# Patient Record
Sex: Male | Born: 1980 | Race: White | Hispanic: No | Marital: Married | Smoking: Never smoker
Health system: Southern US, Community
[De-identification: ages and names within clinical notes are randomized; demographics above are authoritative.]

## PROBLEM LIST (undated history)

## (undated) DIAGNOSIS — I209 Angina pectoris, unspecified: Secondary | ICD-10-CM

## (undated) DIAGNOSIS — K429 Umbilical hernia without obstruction or gangrene: Secondary | ICD-10-CM

## (undated) DIAGNOSIS — F419 Anxiety disorder, unspecified: Secondary | ICD-10-CM

## (undated) DIAGNOSIS — R519 Headache, unspecified: Secondary | ICD-10-CM

## (undated) DIAGNOSIS — Z87442 Personal history of urinary calculi: Secondary | ICD-10-CM

## (undated) DIAGNOSIS — I10 Essential (primary) hypertension: Secondary | ICD-10-CM

## (undated) DIAGNOSIS — N200 Calculus of kidney: Secondary | ICD-10-CM

## (undated) HISTORY — PX: DENTAL SURGERY: SHX609

## (undated) HISTORY — PX: FOOT SURGERY: SHX648

## (undated) HISTORY — PX: KIDNEY STONE SURGERY: SHX686

---

## 2001-05-12 ENCOUNTER — Emergency Department (HOSPITAL_COMMUNITY): Admission: EM | Admit: 2001-05-12 | Discharge: 2001-05-12 | Payer: Self-pay | Admitting: Emergency Medicine

## 2002-03-18 ENCOUNTER — Encounter: Payer: Self-pay | Admitting: Internal Medicine

## 2002-03-18 ENCOUNTER — Encounter: Admission: RE | Admit: 2002-03-18 | Discharge: 2002-03-18 | Payer: Self-pay | Admitting: Internal Medicine

## 2002-03-20 ENCOUNTER — Encounter: Payer: Self-pay | Admitting: Internal Medicine

## 2002-03-20 ENCOUNTER — Encounter: Admission: RE | Admit: 2002-03-20 | Discharge: 2002-03-20 | Payer: Self-pay | Admitting: Internal Medicine

## 2003-11-03 ENCOUNTER — Encounter: Admission: RE | Admit: 2003-11-03 | Discharge: 2003-11-03 | Payer: Self-pay | Admitting: Internal Medicine

## 2007-01-22 ENCOUNTER — Encounter: Payer: Self-pay | Admitting: Emergency Medicine

## 2007-01-22 ENCOUNTER — Observation Stay (HOSPITAL_COMMUNITY): Admission: EM | Admit: 2007-01-22 | Discharge: 2007-01-23 | Payer: Self-pay | Admitting: Cardiology

## 2007-01-22 ENCOUNTER — Ambulatory Visit: Payer: Self-pay | Admitting: Internal Medicine

## 2007-01-25 ENCOUNTER — Ambulatory Visit: Payer: Self-pay

## 2007-02-06 ENCOUNTER — Ambulatory Visit: Payer: Self-pay | Admitting: Cardiology

## 2008-07-14 ENCOUNTER — Ambulatory Visit: Payer: Self-pay | Admitting: Internal Medicine

## 2008-07-14 DIAGNOSIS — R109 Unspecified abdominal pain: Secondary | ICD-10-CM

## 2008-07-14 LAB — CONVERTED CEMR LAB
Nitrite: NEGATIVE
Specific Gravity, Urine: 1.01

## 2008-07-15 ENCOUNTER — Encounter: Payer: Self-pay | Admitting: Internal Medicine

## 2008-07-16 LAB — CONVERTED CEMR LAB
Bacteria, UA: NONE SEEN
RBC / HPF: NONE SEEN (ref <3)
WBC, UA: NONE SEEN cells/hpf (ref <3)

## 2008-07-17 ENCOUNTER — Encounter: Payer: Self-pay | Admitting: Internal Medicine

## 2010-08-23 NOTE — H&P (Signed)
[name], [name]                ACCOUNT NO.:  [id number]   MEDICAL RECORD NO.:  [id number]          PATIENT TYPE:  INP   LOCATION:  3733                         FACILITY:  [facility]   PHYSICIAN:  Bevelyn Buckles. Bensimhon, MDDATE OF BIRTH:  December 23, 1980   DATE OF ADMISSION:  01/22/2007  DATE OF DISCHARGE:                              HISTORY & PHYSICAL   PRIMARY CARE PHYSICIAN:  Dr. Christell Constant at [facility].   PRIMARY CARDIOLOGIST:  New and will be Dr. Gala Romney.   CHIEF COMPLAINT:  Chest pain.   HISTORY OF PRESENT ILLNESS:  [name] is 30 year old male with no  previous history of coronary artery disease.  He complains of about a 6-  week history of intermittent chest pain.  The chest pain would start  with tacky palpitations.  After the palpitations began, he would get  chest pain described as a pressure and a squeezing pain.  The worst  episode was on September 10.  There was also occasionally a stabbing  pain on September 10 associated with this.  The stabbing pain was worse  with movement and would resolve in 4-5 minutes.  The pressure and tachy  palpitations would resolved in 10-15 minutes.  He tried Pepto-Bismol one  time which decreased the pain, but the pain came back.  The chest pain  is just to the left of the sternum.  It is associated with left arm  numbness as well as shortness of breath, and since the pain is severe,  he gets a little nauseated and diaphoretic.  He has had intermittent  symptoms for 6 weeks, but the symptoms woke him last p.m. and lasted  longer than usual.  He went to the [facility] emergency room where three  nitroglycerin seemed to help.  He was transferred to [facility] for  further evaluation.  Of note, the symptoms are not exertional and not  associated with meals.   PAST MEDICAL HISTORY:  1. He has no history of diabetes, hypertension, hyperlipidemia (never      checked).  2. He has never used tobacco.  3. He does have family  history of premature coronary artery disease,      seasonal allergies and has recently had nephrolithiasis.   SURGICAL HISTORY:  Dental surgery.   ALLERGIES:  HE HAS NO ALLERGIES TO ANY MEDICATIONS BUT RECEIVED MORPHINE  AT [place] WHICH HE MADE THEM STOP THE ADMINISTRATION OF BECAUSE IT  WAS BURNING SO BADLY.   CURRENT MEDICATIONS:  He takes no prescription medications except  p.r.n., hydrocodone for the kidney stones.  He takes an occasional  Zyrtec over-the-counter.   SOCIAL HISTORY:  Lives in [place] with his wife and works in Investment banker, corporate.  He has no history of tobacco use.  He denies extreme  caffeine use and does not drink Red Bull.  Prior to having kidney  stones, he was drinking 4-5 sodas a day, but now is down to one a day.   FAMILY HISTORY:  His mother is alive at age 33 and states that she had  an MI  at 56 with a cath but no intervention, although they told her a  small vessel had been blocked, and she has also had a stroke and says  that she has an aneurysm in her abdomen.  His father is alive at age 41  with no history of coronary artery disease.  He has no siblings with  coronary artery disease.   REVIEW OF SYSTEMS:  He states his appetite has been poor.  He has not  been sleeping well.  He has had fatigue.  His weight is down about 10  pounds.  He has had some constipation recently and had diarrhea last  night.  He has had some abdominal pain especially in the right upper  quadrant.  He had flank pain with the kidney stones.  The chest pain as  described above.  He feels that he has dyspnea on exertion which is  recent.  He has had no fevers or chills.  Full 14-point review of  systems is otherwise negative.   PHYSICAL EXAMINATION:  VITAL SIGNS:  Temperature is 98.5, blood pressure  130/71, pulse 99, respiratory rate 18, O2 saturation 98% on room air.  GENERAL:  He is a well-developed, well-nourished white male in no acute  distress.  HEENT:   Normal.  NECK:  There is no lymphadenopathy, thyromegaly, bruit or JVD noted.  CV:  Heart is regular rate and rhythm with an S1-S2 and no significant  murmur, rub or gallop is noted.  Distal pulses are intact in all four  extremities.  LUNGS:  Clear to auscultation bilaterally.  SKIN:  No rashes or lesions are noted.  ABDOMEN:  Soft and he has active bowel sounds and no splenomegaly is  noted, but he has tenderness in the left upper quadrant, most  significantly with some guarding but no rebound, less tenderness in the  right upper quadrant and minimal tenderness in the lower quadrants.  The  spleen is not felt to be enlarged.  EXTREMITIES:  There is no cyanosis, clubbing or edema noted.  MUSCULOSKELETAL:  There is no joint deformity or effusion and no spine  or CVA tenderness.  NEUROLOGICAL:  He is alert and oriented.  Cranial nerves II-XII grossly  intact.   STUDIES:  Chest x-ray no acute disease.   EKG is sinus rhythm rate 92 with no acute ischemic changes.   LABORATORY VALUES:  Hemoglobin 16.5, hematocrit 46.9, WBC 5.7, platelets  198.  Sodium 140, potassium 3.2, chloride 106, CO2 30, BUN 17,  creatinine 1.09, glucose 133.  Point of care markers negative x2 and CK-  MB and troponin I negative times one.   IMPRESSION:  [name] is a 30 year old male with a several week history  of constitutional symptoms who now also has chest pain, abdominal pain  and profound fatigue.  His EKG and initial cardiac enzymes are negative.  On exam, he is very tender over the spleen tip.  We will check an  abdominal and chest CT with contrast as well as an echocardiogram to  assess his LV function and wall motion.  The family is very concerned in  regards to a possible MI.  They were reassured that this was not an  acute MI.  If all other tests are negative, then cardiac catheterization  can be discussed with them to further evaluate his chest pain.  We will  check a TSH, a sed rate, and LDH,  ANA, amylase and lipase as well as a  hemoglobin A1c and a  lipid profile.  His potassium was supplemented at  [facility] and this will be rechecked.      Theodore Demark, PA-C      Bevelyn Buckles. Bensimhon, MD  Electronically Signed    RB/MEDQ  D:  01/22/2007  T:  01/22/2007  Job:  [id number]

## 2010-08-23 NOTE — Assessment & Plan Note (Signed)
[name]                            CARDIOLOGY OFFICE NOTE   [name], [name]                       MRN:          [id number]  DATE:02/06/2007                            DOB:          06/25/1980    The primary is Dr. [place].   REASON FOR PRESENTATION:  Evaluate patient with chest pain.   HISTORY OF PRESENT ILLNESS:  The patient was recently hospitalized with  chest discomfort. He ruled out for myocardial infarction. He had an  echocardiogram which demonstrated no evidence of ischemia or infarct.  His EF was 55%. There were no valvular abnormalities. He did also have a  stress perfusion study as an outpatient. This demonstrated an EF also of  55% with no evidence of ischemia or infarct.   The patient has continued to have occasional chest discomfort. He seems  now to associate this with food. He was started on Protonix. However, he  noted the other day then when he ate some sausage and some bacon he did  have some of this chest discomfort following that. He has been back to  work. He has had no symptoms associated with exertion. He has had no  neck or arm discomfort. He has had no shortness of breath, PND or  orthopnea.   PAST MEDICAL HISTORY:  There is no history of hypertension, diabetes or  hyperlipidemia.   ALLERGIES:  None.   MEDICATIONS:  Protonix 40 mg daily.   REVIEW OF SYSTEMS:  As stated in the HPI and otherwise negative for  other systems.   PHYSICAL EXAMINATION:  The patient is in no distress. Blood pressure  134/84, heart 86 and regular, weight 142 pounds, body mass index 22.  HEENT:  Eyelids unremarkable; pupils are equal, round, and reactive to  light; fundi not visualized. Oral mucosal unremarkable.  NECK:  No jugular venous distention at 45 degrees; carotid upstrokes  brisk and symmetrical; no bruits; no thyromegaly.  LYMPHATICS:  No cervical, axillary, or inguinal adenopathy.  LUNGS:  Clear to auscultation  bilaterally.  BACK:  No costovertebral angle tenderness.  CHEST:  Unremarkable.  HEART:  PMI not displaced or sustained; S1 and S2 within normal limits;  no S3, no murmurs.  ABDOMEN:  Positive bowel sounds, normal in frequency and pitch, no  bruits, no rebound, no guarding, no midline pulsatile mass, no  organomegaly.  SKIN:  No rashes, no nodules.  EXTREMITIES:  2+ pulses throughout, no edema.   EKG:  Sinus rhythm, rate 86, axis within normal limits, intervals within  normal limits, no acute ST-T wave changes.   ASSESSMENT AND PLAN:  1. Chest discomfort. The patient's chest discomfort sounds like it has      gastroenterologic etiology. He says that he has had some      questionable gallbladder problems in the past. I have asked to talk      with his primary care doctor about possibly a GI referral since he      is having some persistent symptoms on the Protonix. At this point,      no further cardiovascular testing  is suggested. He should continue      with primary risk reduction.  2. Followup will be in this office as needed.     Howard Rotunda, MD, [facility]  Electronically Signed    JH/MedQ  DD: 02/06/2007  DT: 02/06/2007  Job #: [id number]   cc:   [name], M.D.

## 2010-08-23 NOTE — Discharge Summary (Signed)
[name], [name]                ACCOUNT NO.:  [id number]   MEDICAL RECORD NO.:  [id number]          PATIENT TYPE:  OBV   LOCATION:  3733                         FACILITY:  [facility]   PHYSICIAN:  Bevelyn Buckles. Bensimhon, MDDATE OF BIRTH:  1980/07/19   DATE OF ADMISSION:  01/22/2007  DATE OF DISCHARGE:  01/23/2007                               DISCHARGE SUMMARY   PRIMARY CARE PHYSICIAN:  Dr. Christell Constant at [facility]   PRIMARY CARDIOLOGIST:  Dr. Rollene Rotunda in [place] office.   PROCEDURES PERFORMED DURING HOSPITALIZATION:  None.   FINAL DISCHARGE DIAGNOSES:  1. Chest pain with negative cardiac enzymes and EKG appeared to be      more musculoskeletal.  2. Gastroesophageal reflux disease.   HOSPITAL COURSE:  This is a 30 year old male with no previous history of  coronary artery disease with a six-week history of intermittent chest  discomfort.  The pain would begin and then he would have tachy  palpitations.  The pains were described as a squeezing pain,  occasionally stabbing and did worsen with movement.  The pressure and  the tachycardia would resolve within 10 to 15 minutes.  Pepto-Bismol did  decrease the pain at home, but it did come back.  It was associated with  left arm numbness as well as shortness of breath.  Secondary to the  severity of the pain, he also gets a little nauseated, diaphoretic, and  had decided to be seen at [facility] Emergency Room where the  nitroglycerin did seem to help.  He was transferred to [facility] for  further evaluation and seen and evaluated by Dr. Arvilla Meres.   The patient was admitted and cardiac enzymes were cycled to evaluate for  ischemic component to his symptoms.  A 2-D echo was also completed  revealing an EF of 55% with normal wall motion.  His cardiac enzymes  were found to be negative x3.  He also had a CT scan to rule out  pulmonary embolus; the result was stable nephrolithiasis.  CT of the  brain without  contrast revealed no acute intrapelvic pathology.  There  was no evidence of acute pulmonary thromboembolism with the CT chest.   The patient was seen and examined by Dr. Arvilla Meres on January 13, 2007 with stable vital signs and negative enzymes and EKG.  The patient  will be discharged home on Protonix once a day and scheduled for an  exercise stress test Myoview study.  The patient will be followed by Dr.  Antoine Poche in [place].   DISCHARGE LABS:  Cholesterol 142, triglycerides 86, HDL 31, LDL 94.  Hemoglobin 16.5, hematocrit 48.3, white blood cells 5.2, platelets 197.  Sodium 141, potassium 4.0, chloride 105, CO2 32, glucose 90, BUN 11,  creatinine 1.26.  TSH 1.033.  Hemoglobin A1c 5.3.  Sed rate 1.  Cardiac  enzymes negative x3 at less than 0.01.   EKG:  Normal sinus rhythm with occasional sinus arrhythmia, ventricular  rate of 92 beats per minute.   DISCHARGE VITAL SIGNS:  Blood pressure 128/76, heart rate 70, O2 sat 99%  on room air.   DISCHARGE MEDICATIONS:  Protonix 40 mg once a day.   ALLERGIES:  No known drug allergies.   FOLLOWUP PLANS AND APPOINTMENTS:  1. The patient will have a stress Myoview completed on October 17 at      11:45 a.m.  2. The patient will follow up with Dr. Rollene Rotunda on August 29 at      11:30 p.m. for discussion of test results.  3. The patient will follow up with Dr. Christell Constant at [facility] for continued medical management.  4. The patient has been given our phone number in [place] to call      should he have any difficulties prior to the test and followup      appointment.   Time spent with the patient to include physician time:  35 minutes.      Bettey Mare. Lyman Bishop, NP      Bevelyn Buckles. Bensimhon, MD  Electronically Signed    KML/MEDQ  D:  01/23/2007  T:  01/23/2007  Job:  [id number]

## 2011-01-18 LAB — CBC
HCT: 48.3
Hemoglobin: 16.5
MCV: 88.9
Platelets: 197
RBC: 5.43
WBC: 5.2

## 2011-01-18 LAB — BASIC METABOLIC PANEL
GFR calc Af Amer: >60
GFR calc non Af Amer: >60

## 2011-01-18 LAB — LIPID PANEL
HDL: 31 — ABNORMAL LOW
Total CHOL/HDL Ratio: 4.6
Triglycerides: 86

## 2011-01-19 LAB — CARDIAC PANEL(CRET KIN+CKTOT+MB+TROPI)
CK, MB: 1.2
Relative Index: INVALID
Total CK: 69
Troponin I: 0.01

## 2011-01-19 LAB — URINALYSIS, ROUTINE W REFLEX MICROSCOPIC
Bilirubin Urine: NEGATIVE
Glucose, UA: NEGATIVE
Hgb urine dipstick: NEGATIVE
Ketones, ur: NEGATIVE
Nitrite: NEGATIVE
Protein, ur: NEGATIVE
Specific Gravity, Urine: 1.012
Urobilinogen, UA: 0.2
pH: 6.5

## 2011-01-19 LAB — HEMOGLOBIN A1C: Mean Plasma Glucose: 111

## 2011-01-19 LAB — DRUGS OF ABUSE SCREEN W/O ALC, ROUTINE URINE
Barbiturate Quant, Ur: NEGATIVE
Benzodiazepines.: NEGATIVE
Creatinine,U: 64.2
Methadone: NEGATIVE
Opiate Screen, Urine: NEGATIVE
Phencyclidine (PCP): NEGATIVE
Propoxyphene: NEGATIVE

## 2011-01-19 LAB — COMPREHENSIVE METABOLIC PANEL WITH GFR
ALT: 15
AST: 17
Albumin: 4.1
Alkaline Phosphatase: 63
BUN: 11
CO2: 26
Calcium: 9.5
Chloride: 106
Creatinine, Ser: 1
GFR calc Af Amer: >60
GFR calc non Af Amer: >60
Glucose, Bld: 99
Potassium: 3.9
Sodium: 141
Total Bilirubin: 0.7
Total Protein: 6.3

## 2011-01-19 LAB — CBC
HCT: 46.9
RDW: 12.9

## 2011-01-19 LAB — DIFFERENTIAL
Basophils Relative: 0
Eosinophils Absolute: 0.1
Lymphocytes Relative: 38
Lymphs Abs: 2.2
Monocytes Absolute: 0.6
Neutro Abs: 2.7
Neutrophils Relative %: 48

## 2011-01-19 LAB — CK TOTAL AND CKMB (NOT AT ARMC)
CK, MB: 1.6
Relative Index: INVALID
Total CK: 95

## 2011-01-19 LAB — POCT CARDIAC MARKERS
CKMB, poc: <1 — ABNORMAL LOW
CKMB, poc: <1 — ABNORMAL LOW
Myoglobin, poc: 32.8
Myoglobin, poc: 39.1
Operator id: 218581
Operator id: 218581
Troponin i, poc: <0.05
Troponin i, poc: <0.05

## 2011-01-19 LAB — TSH: TSH: 1.033

## 2011-01-19 LAB — BASIC METABOLIC PANEL
BUN: 17
CO2: 30
Calcium: 9.7
GFR calc Af Amer: >60

## 2011-01-19 LAB — AMYLASE: Amylase: 63

## 2011-01-19 LAB — ANA: Anti Nuclear Antibody(ANA): NEGATIVE

## 2011-01-19 LAB — TROPONIN I: Troponin I: 0.02

## 2011-01-19 LAB — LACTATE DEHYDROGENASE: LDH: 129

## 2011-11-18 ENCOUNTER — Emergency Department (HOSPITAL_COMMUNITY)
Admission: EM | Admit: 2011-11-18 | Discharge: 2011-11-18 | Disposition: A | Discharge status: Home / Self Care | Payer: 59 | Attending: Emergency Medicine | Admitting: Emergency Medicine

## 2011-11-18 ENCOUNTER — Emergency Department (HOSPITAL_COMMUNITY): Payer: 59

## 2011-11-18 ENCOUNTER — Encounter (HOSPITAL_COMMUNITY): Payer: Self-pay

## 2011-11-18 DIAGNOSIS — R109 Unspecified abdominal pain: Secondary | ICD-10-CM | POA: Insufficient documentation

## 2011-11-18 DIAGNOSIS — Z79899 Other long term (current) drug therapy: Secondary | ICD-10-CM | POA: Insufficient documentation

## 2011-11-18 DIAGNOSIS — N201 Calculus of ureter: Secondary | ICD-10-CM | POA: Insufficient documentation

## 2011-11-18 HISTORY — DX: Calculus of kidney: N20.0

## 2011-11-18 LAB — BASIC METABOLIC PANEL
BUN: 16 mg/dL (ref 6–23)
Chloride: 101 mEq/L (ref 96–112)
Creatinine, Ser: 1.9 mg/dL — ABNORMAL HIGH (ref 0.50–1.35)
GFR calc Af Amer: 53 mL/min — ABNORMAL LOW (ref >90)
GFR calc non Af Amer: 45 mL/min — ABNORMAL LOW (ref >90)

## 2011-11-18 LAB — CBC WITH DIFFERENTIAL/PLATELET
Basophils Relative: 0 % (ref 0–1)
Eosinophils Absolute: 0 10*3/uL (ref 0.0–0.7)
Eosinophils Relative: 0 % (ref 0–5)
HCT: 44 % (ref 39.0–52.0)
Hemoglobin: 15.7 g/dL (ref 13.0–17.0)
MCH: 30.4 pg (ref 26.0–34.0)
MCHC: 35.7 g/dL (ref 30.0–36.0)
MCV: 85.3 fL (ref 78.0–100.0)
Monocytes Absolute: 0.6 10*3/uL (ref 0.1–1.0)
Monocytes Relative: 6 % (ref 3–12)

## 2011-11-18 LAB — URINALYSIS, ROUTINE W REFLEX MICROSCOPIC
Glucose, UA: NEGATIVE mg/dL
Leukocytes, UA: NEGATIVE
Nitrite: NEGATIVE
Protein, ur: NEGATIVE mg/dL
Urobilinogen, UA: 0.2 mg/dL (ref 0.0–1.0)
pH: 6 (ref 5.0–8.0)

## 2011-11-18 LAB — URINE MICROSCOPIC-ADD ON

## 2011-11-18 MED ORDER — NAPROXEN 500 MG PO TABS: 500.0000 mg | ORAL_TABLET | Freq: Two times a day (BID) | ORAL | Status: DC

## 2011-11-18 MED ORDER — KETOROLAC TROMETHAMINE 60 MG/2ML IM SOLN
60.0000 mg | Freq: Once | INTRAMUSCULAR | Status: AC
  Administered 2011-11-18: 60 mg via INTRAMUSCULAR
  Filled 2011-11-18: qty 2

## 2011-11-18 MED ORDER — ONDANSETRON 4 MG PO TBDP
ORAL_TABLET | ORAL | Status: AC
  Administered 2011-11-18: 4 mg via SUBLINGUAL
  Filled 2011-11-18: qty 1

## 2011-11-18 MED ORDER — HYDROMORPHONE HCL PF 2 MG/ML IJ SOLN
2.0000 mg | Freq: Once | INTRAMUSCULAR | Status: AC
  Administered 2011-11-18: 2 mg via INTRAMUSCULAR
  Filled 2011-11-18: qty 1

## 2011-11-18 MED ORDER — OXYCODONE-ACETAMINOPHEN 5-325 MG PO TABS: 1.0000 | ORAL_TABLET | ORAL | Status: AC | PRN

## 2011-11-18 NOTE — ED Notes (Signed)
Patient given second 4 mg odt zofran. Patient still complaining of nausea, states only has to vomit when he sits up. Told patient we could wait a few minutes to give the medication time to work before attempting to get him up.

## 2011-11-18 NOTE — ED Notes (Signed)
Patient stated that he felt as if he was ready to go home. Helped patient out to car via wheelchair. Patient spit up small amount of emesis in car prior to leaving hospital parking lot. Patient stated he still wanted to try to make it home and go to sleep.

## 2011-11-18 NOTE — ED Provider Notes (Signed)
History     CSN: [id number]  Arrival date & time 11/18/11  4098   First MD Initiated Contact with Patient 11/18/11 1859      Chief Complaint  Patient presents with  . Flank Pain    (Consider location/radiation/quality/duration/timing/severity/associated sxs/prior treatment) Patient is a 31 y.o. male presenting with flank pain. The history is provided by the patient and the spouse.  Flank Pain This is a recurrent problem. The current episode started 6 to 12 hours ago. The problem has been gradually worsening. Pertinent negatives include no chest pain, no abdominal pain, no headaches and no shortness of breath.   patient with injury to his left lower back about 3 weeks ago after picking up a trailer patient was seen in emergency department somewhere at that time and treated with pain meds. That did improve over several days today similar pain is restarted in the same area of his left lower back does move to his left flank some. Patient has had a history of kidney stones several times in the past the patient states has not type pain that he relates to kidney stones at all. Patient did take a Percocet tablet at 3:00 this afternoon without relief. His primary care Dr. is [facility].  Pain is described as a 10 out of 10 it's sharp and dull ache nonradiating no numbness to the feet no pain radiates into the legs no focal deficits. No incontinence.  Past Medical History  Diagnosis Date  . Kidney stones     Past Surgical History  Procedure Date  . Dental surgery     History reviewed. No pertinent family history.  History  Substance Use Topics  . Smoking status: Never Smoker   . Smokeless tobacco: Not on file  . Alcohol Use: No      Review of Systems  Constitutional: Negative for fever.  HENT: Negative for neck pain.   Eyes: Negative for redness.  Respiratory: Negative for shortness of breath.   Cardiovascular: Negative for chest pain.    Gastrointestinal: Negative for abdominal pain.  Genitourinary: Positive for flank pain.  Musculoskeletal: Positive for back pain.  Skin: Negative for rash.  Neurological: Negative for headaches.  Hematological: Does not bruise/bleed easily.    Allergies  Bee venom  Home Medications   Current Outpatient Rx  Name Route Sig Dispense Refill  . AMLODIPINE BESYLATE 5 MG PO TABS Oral Take 5 mg by mouth at bedtime.    Marland Kitchen CETIRIZINE HCL 10 MG PO TABS Oral Take 10 mg by mouth daily.    . IBUPROFEN 200 MG PO TABS Oral Take 200 mg by mouth every 6 (six) hours as needed.    Marland Kitchen NAPROXEN 500 MG PO TABS Oral Take 1 tablet (500 mg total) by mouth 2 (two) times daily. 14 tablet 0  . OXYCODONE-ACETAMINOPHEN 5-325 MG PO TABS Oral Take 1-2 tablets by mouth every 4 (four) hours as needed for pain. 14 tablet 0    BP 143/96  Pulse 96  Resp 20  SpO2 100%  Physical Exam  Nursing note and vitals reviewed. Constitutional: He is oriented to person, place, and time. He appears well-developed and well-nourished.  HENT:  Head: Normocephalic and atraumatic.  Eyes: Conjunctivae and EOM are normal. Pupils are equal, round, and reactive to light.  Neck: Normal range of motion. Neck supple.  Cardiovascular: Normal rate, regular rhythm and normal heart sounds.   No murmur heard. Pulmonary/Chest: Effort normal and breath sounds normal.  Abdominal: Soft. Bowel  sounds are normal. There is no tenderness.  Musculoskeletal: Normal range of motion. He exhibits no edema.       Except for some mild tenderness in the left lumbar area no spasm.  Neurological: He is alert and oriented to person, place, and time. No cranial nerve deficit. He exhibits normal muscle tone. Coordination normal.  Skin: Skin is warm. No rash noted.    ED Course  Procedures (including critical care time)  Labs Reviewed  URINALYSIS, ROUTINE W REFLEX MICROSCOPIC - Abnormal; Notable for the following:    Specific Gravity, Urine >1.030 (*)      Hgb urine dipstick LARGE (*)     Bilirubin Urine SMALL (*)     Ketones, ur TRACE (*)     All other components within normal limits  CBC WITH DIFFERENTIAL - Abnormal; Notable for the following:    Neutrophils Relative 84 (*)     Neutro Abs 7.9 (*)     Lymphocytes Relative 10 (*)     All other components within normal limits  BASIC METABOLIC PANEL - Abnormal; Notable for the following:    Glucose, Bld 107 (*)     Creatinine, Ser 1.90 (*)     GFR calc non Af Amer 45 (*)     GFR calc Af Amer 53 (*)     All other components within normal limits  URINE MICROSCOPIC-ADD ON   Ct Abdomen Pelvis Wo Contrast  11/18/2011  *RADIOLOGY REPORT*  Clinical Data: Left flank pain.  CT ABDOMEN AND PELVIS WITHOUT CONTRAST  Technique:  Multidetector CT imaging of the abdomen and pelvis was performed following the standard protocol without intravenous contrast.  Comparison: 01/22/2007.  Findings: Left ureteral 5.2 mm obstructing stone located 17 mm proximal to the left ureteral vesicle junction.  Moderate left side hydronephrosis.  Several bilateral nonobstructing renal calculi.  No extraluminal bowel inflammatory process, free fluid or free air.  Evaluation of solid abdominal viscera is limited by lack of IV contrast.  Taking this limitation into account no focal liver, splenic, pancreatic or adrenal lesion.  No calcified gallstones.  No abdominal aortic aneurysm.  Contracted urinary bladder.  No bony destructive lesion.  IMPRESSION:  Left ureteral 5.2 mm obstructing stone located 17 mm proximal to the left ureteral vesicle junction.  Moderate left side hydronephrosis.  Several bilateral nonobstructing renal calculi.  Original Report Authenticated By: Fuller Canada, M.D.   Results for orders placed during the hospital encounter of 11/18/11  URINALYSIS, ROUTINE W REFLEX MICROSCOPIC      Component Value Range   Color, Urine YELLOW  YELLOW   APPearance CLEAR  CLEAR   Specific Gravity, Urine >1.030 (*) 1.005 - 1.030     pH 6.0  5.0 - 8.0   Glucose, UA NEGATIVE  NEGATIVE mg/dL   Hgb urine dipstick LARGE (*) NEGATIVE   Bilirubin Urine SMALL (*) NEGATIVE   Ketones, ur TRACE (*) NEGATIVE mg/dL   Protein, ur NEGATIVE  NEGATIVE mg/dL   Urobilinogen, UA 0.2  0.0 - 1.0 mg/dL   Nitrite NEGATIVE  NEGATIVE   Leukocytes, UA NEGATIVE  NEGATIVE  URINE MICROSCOPIC-ADD ON      Component Value Range   WBC, UA 3-6  <3 WBC/hpf   RBC / HPF 21-50  <3 RBC/hpf  CBC WITH DIFFERENTIAL      Component Value Range   WBC 9.4  4.0 - 10.5 K/uL   RBC 5.16  4.22 - 5.81 MIL/uL   Hemoglobin 15.7  13.0 - 17.0 g/dL  HCT 44.0  39.0 - 52.0 %   MCV 85.3  78.0 - 100.0 fL   MCH 30.4  26.0 - 34.0 pg   MCHC 35.7  30.0 - 36.0 g/dL   RDW 16.1  09.6 - 04.5 %   Platelets 193  150 - 400 K/uL   Neutrophils Relative 84 (*) 43 - 77 %   Neutro Abs 7.9 (*) 1.7 - 7.7 K/uL   Lymphocytes Relative 10 (*) 12 - 46 %   Lymphs Abs 0.9  0.7 - 4.0 K/uL   Monocytes Relative 6  3 - 12 %   Monocytes Absolute 0.6  0.1 - 1.0 K/uL   Eosinophils Relative 0  0 - 5 %   Eosinophils Absolute 0.0  0.0 - 0.7 K/uL   Basophils Relative 0  0 - 1 %   Basophils Absolute 0.0  0.0 - 0.1 K/uL  BASIC METABOLIC PANEL      Component Value Range   Sodium 137  135 - 145 mEq/L   Potassium 3.7  3.5 - 5.1 mEq/L   Chloride 101  96 - 112 mEq/L   CO2 26  19 - 32 mEq/L   Glucose, Bld 107 (*) 70 - 99 mg/dL   BUN 16  6 - 23 mg/dL   Creatinine, Ser 4.09 (*) 0.50 - 1.35 mg/dL   Calcium 81.1  8.4 - 91.4 mg/dL   GFR calc non Af Amer 45 (*) >90 mL/min   GFR calc Af Amer 53 (*) >90 mL/min     1. Ureteral stone       MDM  Despite patient saying the pain is not consistent with passed kidney stones he does have a fair amount of hematuria on his urinalysis will go ahead and get CT of abdomen and pelvis without contrast to further evaluate the possibility of a ureteral stone.  CT scan of the abdomen confirms a 5.2 mm left ureteral stone that is 17 mm from the bladder. Patient  should pass a stone soon. Pain improved in the emergency department. We'll give a dose of Toradol prior to discharge. Patient has Percocet at home for now patient has had several stones in the past primary care doctors used to treating him for this. Urinalysis consistent with hematuria no evidence urinary tract infection no sniffing and leukocytosis mild elevation in creatinine at 1.9.       Shelda Jakes, MD 11/18/11 [phone]

## 2011-11-18 NOTE — ED Notes (Signed)
Spoke with Dr. Preston Fleeting regarding patient still vomiting. Dr. Saul Fordyce out of department. Dr. Preston Fleeting stated to give another 4 mg odt zofran.

## 2011-11-18 NOTE — ED Notes (Signed)
Patient began vomiting when getting in wheelchair to be discharged. Dr. Saul Fordyce stated to give patient odt 4 mg zofran, medication given to patient.

## 2011-11-18 NOTE — ED Notes (Signed)
Lower left flank pain, has a history of kidney stones but states it does not feel like one. 2-3 weeks ago I picked up a trailer and felt something pull. Did have pain but that went away.

## 2011-11-20 ENCOUNTER — Emergency Department (HOSPITAL_COMMUNITY): Payer: 59

## 2011-11-20 ENCOUNTER — Emergency Department (HOSPITAL_COMMUNITY)
Admission: EM | Admit: 2011-11-20 | Discharge: 2011-11-20 | Disposition: A | Discharge status: Home / Self Care | Payer: 59 | Attending: Emergency Medicine | Admitting: Emergency Medicine

## 2011-11-20 ENCOUNTER — Encounter (HOSPITAL_COMMUNITY): Payer: Self-pay | Admitting: *Deleted

## 2011-11-20 DIAGNOSIS — M545 Low back pain, unspecified: Secondary | ICD-10-CM | POA: Insufficient documentation

## 2011-11-20 DIAGNOSIS — N201 Calculus of ureter: Secondary | ICD-10-CM | POA: Insufficient documentation

## 2011-11-20 DIAGNOSIS — R109 Unspecified abdominal pain: Secondary | ICD-10-CM | POA: Insufficient documentation

## 2011-11-20 LAB — CBC WITH DIFFERENTIAL/PLATELET
Basophils Relative: 0 % (ref 0–1)
Eosinophils Absolute: 0.1 10*3/uL (ref 0.0–0.7)
Eosinophils Relative: 2 % (ref 0–5)
HCT: 43.9 % (ref 39.0–52.0)
Hemoglobin: 15.5 g/dL (ref 13.0–17.0)
MCH: 30.4 pg (ref 26.0–34.0)
MCHC: 35.3 g/dL (ref 30.0–36.0)
Monocytes Absolute: 0.5 10*3/uL (ref 0.1–1.0)
Monocytes Relative: 9 % (ref 3–12)
Neutrophils Relative %: 70 % (ref 43–77)

## 2011-11-20 LAB — URINALYSIS, ROUTINE W REFLEX MICROSCOPIC
Ketones, ur: NEGATIVE mg/dL
Nitrite: NEGATIVE
pH: 5.5 (ref 5.0–8.0)

## 2011-11-20 LAB — BASIC METABOLIC PANEL
BUN: 19 mg/dL (ref 6–23)
Calcium: 9.8 mg/dL (ref 8.4–10.5)
Creatinine, Ser: 1.46 mg/dL — ABNORMAL HIGH (ref 0.50–1.35)
GFR calc Af Amer: 72 mL/min — ABNORMAL LOW (ref >90)
GFR calc non Af Amer: 63 mL/min — ABNORMAL LOW (ref >90)

## 2011-11-20 MED ORDER — HYDROMORPHONE HCL PF 1 MG/ML IJ SOLN
1.0000 mg | Freq: Once | INTRAMUSCULAR | Status: AC
  Administered 2011-11-20: 1 mg via INTRAVENOUS
  Filled 2011-11-20: qty 1

## 2011-11-20 MED ORDER — ONDANSETRON HCL 4 MG/2ML IJ SOLN
4.0000 mg | Freq: Once | INTRAMUSCULAR | Status: AC
  Administered 2011-11-20: 4 mg via INTRAVENOUS
  Filled 2011-11-20: qty 2

## 2011-11-20 MED ORDER — SODIUM CHLORIDE 0.9 % IV BOLUS (SEPSIS)
500.0000 mL | Freq: Once | INTRAVENOUS | Status: AC
  Administered 2011-11-20: 17:00:00 via INTRAVENOUS

## 2011-11-20 MED ORDER — SODIUM CHLORIDE 0.9 % IV SOLN: INTRAVENOUS | Status: DC

## 2011-11-20 MED ORDER — ONDANSETRON 4 MG PO TBDP: 4.0000 mg | ORAL_TABLET | Freq: Three times a day (TID) | ORAL | Status: AC | PRN

## 2011-11-20 MED ORDER — PROMETHAZINE HCL 25 MG PO TABS: 25.0000 mg | ORAL_TABLET | Freq: Four times a day (QID) | ORAL | Status: DC | PRN

## 2011-11-20 NOTE — ED Notes (Signed)
Pt c/o left flank pain off and on since Saturday. Pt states that he was seen on Saturday and told that he had a kidney stone. States that his pain and nausea are worse today. Pt alert and oriented x 3. Skin warm and dry. Color pink. Breath sounds clear and equal bilaterally.

## 2011-11-20 NOTE — ED Notes (Signed)
Being tx for kidney stone, seen here 8/10. Says pain is worse.Nausea,

## 2011-11-20 NOTE — ED Provider Notes (Signed)
History  This chart was scribed for Shelda Jakes, MD by Erskine Emery. This patient was seen in room APA07/APA07 and the patient's care was started at 16:20.   CSN: [id number]  Arrival date & time 11/20/11  1612   First MD Initiated Contact with Patient 11/20/11 1620      Chief Complaint  Patient presents with  . Flank Pain    (Consider location/radiation/quality/duration/timing/severity/associated sxs/prior treatment) HPI [name] is a 31 y.o. male who presents to the Emergency Department complaining of severe left flank and lower back pain that first began on Friday (2 days ago) with no associated fevers, chest pain, SOB, testicle pain, or groin pain. Pt was seen here on 8/10 (2 days ago) for the same complaint, was scanned, and a kidney stone was found (under 6mm in size, less than 17mm from opening). Pt reports his urine is darker now than it was then. Pt denies passing the stone and reports yesterday he had constant left abdominal pain that has moved to the left flank and lower back today. Pt reports the pain came back at 9am this morning, whereupon he began taking pain medication again. Pt reports he has taken 3 pain pills since then that only temporarily relieve the pain. Pt works in [employer] and had a back injury about a month ago that was healing well.    Past Medical History  Diagnosis Date  . Kidney stones     Past Surgical History  Procedure Date  . Dental surgery     History reviewed. No pertinent family history.  History  Substance Use Topics  . Smoking status: Never Smoker   . Smokeless tobacco: Not on file  . Alcohol Use: No      Review of Systems  Constitutional: Negative for fever and chills.  HENT: Negative for neck pain.   Respiratory: Negative for shortness of breath.   Cardiovascular: Negative for chest pain.  Gastrointestinal: Negative for nausea and vomiting.  Genitourinary: Negative for penile pain and testicular pain.    Musculoskeletal: Positive for back pain (left lower).  Skin: Negative for rash.  Neurological: Negative for weakness.  Psychiatric/Behavioral: Negative for hallucinations.  All other systems reviewed and are negative.    Allergies  Bee venom  Home Medications   Current Outpatient Rx  Name Route Sig Dispense Refill  . AMLODIPINE BESYLATE 5 MG PO TABS Oral Take 5 mg by mouth at bedtime.    Marland Kitchen CETIRIZINE HCL 10 MG PO TABS Oral Take 10 mg by mouth daily.    . IBUPROFEN 200 MG PO TABS Oral Take 200 mg by mouth every 6 (six) hours as needed.    . OXYCODONE-ACETAMINOPHEN 5-325 MG PO TABS Oral Take 1-2 tablets by mouth every 4 (four) hours as needed for pain. 14 tablet 0  . ONDANSETRON 4 MG PO TBDP Oral Take 1 tablet (4 mg total) by mouth every 8 (eight) hours as needed for nausea. 12 tablet 0  . PROMETHAZINE HCL 25 MG PO TABS Oral Take 1 tablet (25 mg total) by mouth every 6 (six) hours as needed for nausea. 12 tablet 0    Triage Vitals: BP 137/90  Pulse 103  Temp 98.3 F (36.8 C) (Oral)  Resp 20  Ht 5\' 5"  (1.651 m)  Wt 163 lb (73.936 kg)  BMI 27.12 kg/m2  SpO2 99%  Physical Exam  Nursing note and vitals reviewed. Constitutional: He is oriented to person, place, and time. He appears well-developed and well-nourished. No distress.  HENT:  Head: Normocephalic and atraumatic.  Mouth/Throat: Oropharynx is clear and moist.  Eyes: EOM are normal.  Neck: Neck supple. No tracheal deviation present.  Cardiovascular: Normal rate, regular rhythm and normal heart sounds.   No murmur heard. Pulmonary/Chest: Effort normal and breath sounds normal. No respiratory distress.  Abdominal: Soft. Bowel sounds are normal. There is no tenderness.  Musculoskeletal: Normal range of motion. He exhibits no edema.  Neurological: He is alert and oriented to person, place, and time. No cranial nerve deficit. Coordination normal.  Skin: Skin is warm and dry.  Psychiatric: He has a normal mood and affect.     ED Course  Procedures (including critical care time) DIAGNOSTIC STUDIES: Oxygen Saturation is 99% on room air, normal by my interpretation.    COORDINATION OF CARE: 16:54--I evaluated the patient and we discussed a treatment plan including CT scans to which the pt agreed. I informed the pt that it is possible he has another kidney stone.   17:50--I rechecked the pt. His kidney stone has not moved and he has no new stones. His urologist is Dr. Annabell Howells.   Labs Reviewed  URINALYSIS, ROUTINE W REFLEX MICROSCOPIC - Abnormal; Notable for the following:    Specific Gravity, Urine >1.030 (*)     Hgb urine dipstick LARGE (*)     Bilirubin Urine SMALL (*)     Protein, ur 30 (*)     All other components within normal limits  BASIC METABOLIC PANEL - Abnormal; Notable for the following:    Potassium 3.1 (*)     Glucose, Bld 128 (*)     Creatinine, Ser 1.46 (*)     GFR calc non Af Amer 63 (*)     GFR calc Af Amer 72 (*)     All other components within normal limits  URINE MICROSCOPIC-ADD ON - Abnormal; Notable for the following:    Bacteria, UA FEW (*)     All other components within normal limits  CBC WITH DIFFERENTIAL   Ct Abdomen Pelvis Wo Contrast  11/20/2011  *RADIOLOGY REPORT*  Clinical Data: Left flank pain, nausea, history of nephrolithiasis.  CT ABDOMEN AND PELVIS WITHOUT CONTRAST  Technique:  Multidetector CT imaging of the abdomen and pelvis was performed following the standard protocol without intravenous contrast.  Comparison: 11/18/2011  Findings: Minimal dependent atelectasis posteriorly in the visualized lung bases.  Unremarkable uninfused evaluation of liver, gallbladder, spleen, pancreas, adrenal glands, abdominal aorta, stomach, small bowel, appendix, colon.  There is bilateral nephrolithiasis, largest stone on the left in the upper pole 7 mm, on the right in the upper pole 3 mm.  There is an obstructing 5 mm proximal left ureteral calculus with mild hydronephrosis, just below  the level of the aortic bifurcation, with only slight advancement since previous scan.  The right ureter is decompressed.  Urinary bladder incompletely distended.  No ascites.  No free air.  No adenopathy localized.  Regional bones unremarkable.  IMPRESSION:  1. Persistent obstructing 5 mm left ureteral calculus with minimal progression since previous scan. 2.  Bilateral nephrolithiasis.  Original Report Authenticated By: Osa Craver, M.D.   Ct Abdomen Pelvis Wo Contrast  11/18/2011  *RADIOLOGY REPORT*  Clinical Data: Left flank pain.  CT ABDOMEN AND PELVIS WITHOUT CONTRAST  Technique:  Multidetector CT imaging of the abdomen and pelvis was performed following the standard protocol without intravenous contrast.  Comparison: 01/22/2007.  Findings: Left ureteral 5.2 mm obstructing stone located 17 mm proximal to the left  ureteral vesicle junction.  Moderate left side hydronephrosis.  Several bilateral nonobstructing renal calculi.  No extraluminal bowel inflammatory process, free fluid or free air.  Evaluation of solid abdominal viscera is limited by lack of IV contrast.  Taking this limitation into account no focal liver, splenic, pancreatic or adrenal lesion.  No calcified gallstones.  No abdominal aortic aneurysm.  Contracted urinary bladder.  No bony destructive lesion.  IMPRESSION:  Left ureteral 5.2 mm obstructing stone located 17 mm proximal to the left ureteral vesicle junction.  Moderate left side hydronephrosis.  Several bilateral nonobstructing renal calculi.  Original Report Authenticated By: Fuller Canada, M.D.   Results for orders placed during the hospital encounter of 11/20/11  URINALYSIS, ROUTINE W REFLEX MICROSCOPIC      Component Value Range   Color, Urine YELLOW  YELLOW   APPearance CLEAR  CLEAR   Specific Gravity, Urine >1.030 (*) 1.005 - 1.030   pH 5.5  5.0 - 8.0   Glucose, UA NEGATIVE  NEGATIVE mg/dL   Hgb urine dipstick LARGE (*) NEGATIVE   Bilirubin Urine SMALL (*)  NEGATIVE   Ketones, ur NEGATIVE  NEGATIVE mg/dL   Protein, ur 30 (*) NEGATIVE mg/dL   Urobilinogen, UA 0.2  0.0 - 1.0 mg/dL   Nitrite NEGATIVE  NEGATIVE   Leukocytes, UA NEGATIVE  NEGATIVE  CBC WITH DIFFERENTIAL      Component Value Range   WBC 5.1  4.0 - 10.5 K/uL   RBC 5.10  4.22 - 5.81 MIL/uL   Hemoglobin 15.5  13.0 - 17.0 g/dL   HCT 40.9  81.1 - 91.4 %   MCV 86.1  78.0 - 100.0 fL   MCH 30.4  26.0 - 34.0 pg   MCHC 35.3  30.0 - 36.0 g/dL   RDW 78.2  95.6 - 21.3 %   Platelets 158  150 - 400 K/uL   Neutrophils Relative 70  43 - 77 %   Neutro Abs 3.5  1.7 - 7.7 K/uL   Lymphocytes Relative 19  12 - 46 %   Lymphs Abs 1.0  0.7 - 4.0 K/uL   Monocytes Relative 9  3 - 12 %   Monocytes Absolute 0.5  0.1 - 1.0 K/uL   Eosinophils Relative 2  0 - 5 %   Eosinophils Absolute 0.1  0.0 - 0.7 K/uL   Basophils Relative 0  0 - 1 %   Basophils Absolute 0.0  0.0 - 0.1 K/uL  BASIC METABOLIC PANEL      Component Value Range   Sodium 140  135 - 145 mEq/L   Potassium 3.1 (*) 3.5 - 5.1 mEq/L   Chloride 103  96 - 112 mEq/L   CO2 28  19 - 32 mEq/L   Glucose, Bld 128 (*) 70 - 99 mg/dL   BUN 19  6 - 23 mg/dL   Creatinine, Ser 0.86 (*) 0.50 - 1.35 mg/dL   Calcium 9.8  8.4 - 57.8 mg/dL   GFR calc non Af Amer 63 (*) >90 mL/min   GFR calc Af Amer 72 (*) >90 mL/min  URINE MICROSCOPIC-ADD ON      Component Value Range   Squamous Epithelial / LPF RARE  RARE   WBC, UA 3-6  <3 WBC/hpf   RBC / HPF TOO NUMEROUS TO COUNT  <3 RBC/hpf   Bacteria, UA FEW (*) RARE      1. Flank pain   2. Ureteral stone       MDM   followup  for a left-sided ureteral stone no significant change but is been a little bit of movement. Renal function is improved from 2 days ago no evidence of urinary tract infection. Patient followed by urology in the past Dr. Annabell Howells, patient will call him in the morning for followup appointment in case the kidney stone has to be removed. Patient still has adequate amount of pain medicine have  added the Zofran and Phenergan for the nausea.     I personally performed the services described in this documentation, which was scribed in my presence. The recorded information has been reviewed and considered.     Shelda Jakes, MD 11/20/11 Rickey Primus

## 2011-12-04 ENCOUNTER — Encounter (HOSPITAL_COMMUNITY): Payer: Self-pay | Admitting: *Deleted

## 2011-12-04 ENCOUNTER — Other Ambulatory Visit: Payer: Self-pay | Admitting: Urology

## 2011-12-04 ENCOUNTER — Ambulatory Visit (HOSPITAL_COMMUNITY)
Admission: RE | Admit: 2011-12-04 | Discharge: 2011-12-04 | Disposition: A | Discharge status: Home / Self Care | Payer: 59 | Source: Ambulatory Visit | Attending: Urology | Admitting: Urology

## 2011-12-04 ENCOUNTER — Encounter (HOSPITAL_COMMUNITY)
Admission: RE | Disposition: A | Discharge status: Home / Self Care | Payer: Self-pay | Source: Ambulatory Visit | Attending: Urology

## 2011-12-04 ENCOUNTER — Inpatient Hospital Stay (HOSPITAL_COMMUNITY): Payer: 59 | Admitting: *Deleted

## 2011-12-04 DIAGNOSIS — N201 Calculus of ureter: Secondary | ICD-10-CM | POA: Insufficient documentation

## 2011-12-04 HISTORY — DX: Angina pectoris, unspecified: I20.9

## 2011-12-04 LAB — BASIC METABOLIC PANEL
BUN: 16 mg/dL (ref 6–23)
CO2: 28 mEq/L (ref 19–32)
Chloride: 102 mEq/L (ref 96–112)
Creatinine, Ser: 1.32 mg/dL (ref 0.50–1.35)

## 2011-12-04 SURGERY — CYSTOURETEROSCOPY, WITH RETROGRADE PYELOGRAM AND STENT INSERTION
Anesthesia: General | Site: Abdomen | Laterality: Left | Wound class: Clean Contaminated

## 2011-12-04 MED ORDER — ACETAMINOPHEN 10 MG/ML IV SOLN
INTRAVENOUS | Status: AC
  Filled 2011-12-04: qty 400

## 2011-12-04 MED ORDER — FENTANYL CITRATE 0.05 MG/ML IJ SOLN: 25.0000 ug | INTRAMUSCULAR | Status: DC | PRN

## 2011-12-04 MED ORDER — PROPOFOL 10 MG/ML IV BOLUS
INTRAVENOUS | Status: DC | PRN
  Administered 2011-12-04: 200 mg via INTRAVENOUS

## 2011-12-04 MED ORDER — ACETAMINOPHEN 10 MG/ML IV SOLN
INTRAVENOUS | Status: DC | PRN
  Administered 2011-12-04: 1000 mg via INTRAVENOUS

## 2011-12-04 MED ORDER — CEFAZOLIN SODIUM 1-5 GM-% IV SOLN
1.0000 g | INTRAVENOUS | Status: AC
  Administered 2011-12-04: 2 g via INTRAVENOUS

## 2011-12-04 MED ORDER — FENTANYL CITRATE 0.05 MG/ML IJ SOLN
INTRAMUSCULAR | Status: DC | PRN
  Administered 2011-12-04 (×2): 50 ug via INTRAVENOUS
  Administered 2011-12-04: 100 ug via INTRAVENOUS

## 2011-12-04 MED ORDER — IOHEXOL 300 MG/ML  SOLN
INTRAMUSCULAR | Status: AC
  Filled 2011-12-04: qty 1

## 2011-12-04 MED ORDER — ONDANSETRON 8 MG PO TBDP: 8.0000 mg | ORAL_TABLET | Freq: Three times a day (TID) | ORAL | Status: AC | PRN

## 2011-12-04 MED ORDER — SODIUM CHLORIDE 0.9 % IJ SOLN: 3.0000 mL | Freq: Two times a day (BID) | INTRAMUSCULAR | Status: DC

## 2011-12-04 MED ORDER — SODIUM CHLORIDE 0.9 % IV SOLN: 250.0000 mL | INTRAVENOUS | Status: DC | PRN

## 2011-12-04 MED ORDER — HYDROMORPHONE HCL 2 MG PO TABS: 2.0000 mg | ORAL_TABLET | ORAL | Status: AC | PRN

## 2011-12-04 MED ORDER — LACTATED RINGERS IV SOLN: INTRAVENOUS | Status: DC

## 2011-12-04 MED ORDER — PROMETHAZINE HCL 25 MG/ML IJ SOLN: 6.2500 mg | INTRAMUSCULAR | Status: DC | PRN

## 2011-12-04 MED ORDER — SODIUM CHLORIDE 0.9 % IR SOLN
Status: DC | PRN
  Administered 2011-12-04: 4000 mL via INTRAVESICAL

## 2011-12-04 MED ORDER — CEFAZOLIN SODIUM-DEXTROSE 2-3 GM-% IV SOLR
INTRAVENOUS | Status: AC
  Filled 2011-12-04: qty 50

## 2011-12-04 MED ORDER — ACETAMINOPHEN 650 MG RE SUPP
650.0000 mg | RECTAL | Status: DC | PRN
  Filled 2011-12-04: qty 1

## 2011-12-04 MED ORDER — OXYCODONE HCL 5 MG PO TABS: 5.0000 mg | ORAL_TABLET | ORAL | Status: DC | PRN

## 2011-12-04 MED ORDER — ACETAMINOPHEN 325 MG PO TABS
650.0000 mg | ORAL_TABLET | ORAL | Status: DC | PRN
Start: 2011-12-04 — End: 2011-12-04

## 2011-12-04 MED ORDER — PHENAZOPYRIDINE HCL 200 MG PO TABS: 200.0000 mg | ORAL_TABLET | Freq: Three times a day (TID) | ORAL | Status: AC | PRN

## 2011-12-04 MED ORDER — LIDOCAINE HCL 2 % EX GEL
CUTANEOUS | Status: DC | PRN
  Administered 2011-12-04: 1 via TOPICAL

## 2011-12-04 MED ORDER — LIDOCAINE HCL 2 % EX GEL
CUTANEOUS | Status: AC
  Filled 2011-12-04: qty 10

## 2011-12-04 MED ORDER — BELLADONNA ALKALOIDS-OPIUM 16.2-60 MG RE SUPP
RECTAL | Status: AC
  Filled 2011-12-04: qty 1

## 2011-12-04 MED ORDER — MIDAZOLAM HCL 5 MG/5ML IJ SOLN
INTRAMUSCULAR | Status: DC | PRN
  Administered 2011-12-04: 2 mg via INTRAVENOUS

## 2011-12-04 MED ORDER — ONDANSETRON HCL 4 MG/2ML IJ SOLN
INTRAMUSCULAR | Status: DC | PRN
  Administered 2011-12-04: 4 mg via INTRAVENOUS

## 2011-12-04 MED ORDER — ONDANSETRON HCL 4 MG/2ML IJ SOLN: 4.0000 mg | Freq: Four times a day (QID) | INTRAMUSCULAR | Status: DC | PRN

## 2011-12-04 MED ORDER — SODIUM CHLORIDE 0.9 % IJ SOLN: 3.0000 mL | INTRAMUSCULAR | Status: DC | PRN

## 2011-12-04 MED ORDER — MEPERIDINE HCL 50 MG/ML IJ SOLN: 6.2500 mg | INTRAMUSCULAR | Status: DC | PRN

## 2011-12-04 MED ORDER — LACTATED RINGERS IV SOLN
INTRAVENOUS | Status: DC
  Administered 2011-12-04: 18:00:00 via INTRAVENOUS
  Administered 2011-12-04: 1000 mL via INTRAVENOUS

## 2011-12-04 MED ORDER — LIDOCAINE HCL (CARDIAC) 20 MG/ML IV SOLN
INTRAVENOUS | Status: DC | PRN
  Administered 2011-12-04: 100 mg via INTRAVENOUS

## 2011-12-04 MED ORDER — BELLADONNA ALKALOIDS-OPIUM 16.2-60 MG RE SUPP
RECTAL | Status: DC | PRN
  Administered 2011-12-04: 1 via RECTAL

## 2011-12-04 MED ORDER — MUPIROCIN 2 % EX OINT
TOPICAL_OINTMENT | Freq: Two times a day (BID) | CUTANEOUS | Status: DC
  Administered 2011-12-04: 1 via NASAL
  Filled 2011-12-04: qty 22

## 2011-12-04 SURGICAL SUPPLY — 16 items
BAG URO CATCHER STRL LF (DRAPE) ×2 IMPLANT
BASKET ZERO TIP NITINOL 2.4FR (BASKET) ×1 IMPLANT
BSKT STON RTRVL ZERO TP 2.4FR (BASKET) ×1
CATH URET 5FR 28IN OPEN ENDED (CATHETERS) ×2 IMPLANT
CLOTH BEACON ORANGE TIMEOUT ST (SAFETY) ×2 IMPLANT
DRAPE CAMERA CLOSED 9X96 (DRAPES) ×2 IMPLANT
GLOVE SURG SS PI 8.0 STRL IVOR (GLOVE) ×2 IMPLANT
GOWN PREVENTION PLUS XLARGE (GOWN DISPOSABLE) ×2 IMPLANT
GOWN STRL REIN XL XLG (GOWN DISPOSABLE) ×2 IMPLANT
LASER FIBER DISP (UROLOGICAL SUPPLIES) ×1 IMPLANT
MANIFOLD NEPTUNE II (INSTRUMENTS) ×2 IMPLANT
MARKER SKIN DUAL TIP RULER LAB (MISCELLANEOUS) ×2 IMPLANT
PACK CYSTO (CUSTOM PROCEDURE TRAY) ×2 IMPLANT
SHEATH URET ACCESS 12FR/35CM (UROLOGICAL SUPPLIES) ×1 IMPLANT
STENT CONTOUR 6FRX26X.038 (STENTS) ×1 IMPLANT
TUBING CONNECTING 10 (TUBING) ×2 IMPLANT

## 2011-12-04 NOTE — Anesthesia Preprocedure Evaluation (Signed)

## 2011-12-04 NOTE — Progress Notes (Signed)
DC Note: Pt alert and oriented x 4, mae x 4, able to ambulate to BR without assistance, gait very steady, pt tolerating PO fluids, denies any pain or discomfort at DC time. Pt voided approx 200 ml of urine w/o difficulty, stent string remains intact and secured to pt. DC instructions reviewed with wife and pt. Pt teaching done (1) safety while taking pain medicine (2) other Rx given to pt and reason for (3) when to call MD for problems (4) Diet and activity upon discharge from [facility]. Opportunity for questions provided several times while dc instructions being reviewed, Teach Back done, pt and wife able to repeat important dc instructions given by PACU RN. Copy of instructions along with three (3) Rx given to wife. Pt escorted to private vehicle via wheelchair, still able to ambulate w/o assitance, no change in pt status as previously documented at dc time. Again opportunity for questions and clarification again provided prior to pt and wife leaving hospital.

## 2011-12-04 NOTE — Brief Op Note (Signed)
12/04/2011  6:27 PM  PATIENT:  Weston Settle  31 y.o. male  PRE-OPERATIVE DIAGNOSIS:  Proximal and distal left ureteral calculi  POST-OPERATIVE DIAGNOSIS: same  PROCEDURE:  Procedure(s) (LRB): CYSTOSCOPY WITH RETROGRADE PYELOGRAM, URETEROSCOPY AND STENT PLACEMENT (Left) HOLMIUM LASER APPLICATION (Left)  SURGEON:  Surgeon(s) and Role:    * Anner Crete, MD - Primary  PHYSICIAN ASSISTANT:   ASSISTANTS: none   ANESTHESIA:   general  EBL:  Total I/O In: 1000 [I.V.:1000] Out: -   BLOOD ADMINISTERED:none  DRAINS: 6x26 left JJ stent.    LOCAL MEDICATIONS USED:  LIDOCAINE  and Amount: Jelly 2% 10 ml  SPECIMEN:  Source of Specimen:  stone fragments  DISPOSITION OF SPECIMEN:  to patient to bring to office  COUNTS:  YES  TOURNIQUET:  * No tourniquets in log *  DICTATION: .Other Dictation: Dictation Number [id number]  PLAN OF CARE: Discharge to home after PACU  PATIENT DISPOSITION:  PACU - hemodynamically stable.   Delay start of Pharmacological VTE agent (>24hrs) due to surgical blood loss or risk of bleeding: no

## 2011-12-04 NOTE — Anesthesia Postprocedure Evaluation (Signed)
  Anesthesia Post-op Note  Patient: [name]  Procedure(s) Performed: Procedure(s) (LRB): CYSTOSCOPY WITH RETROGRADE PYELOGRAM, URETEROSCOPY AND STENT PLACEMENT (Left) HOLMIUM LASER APPLICATION (Left)  Patient Location: PACU  Anesthesia Type: General  Level of Consciousness: awake and alert   Airway and Oxygen Therapy: Patient Spontanous Breathing  Post-op Pain: mild  Post-op Assessment: Post-op Vital signs reviewed, Patient's Cardiovascular Status Stable, Respiratory Function Stable, Patent Airway and No signs of Nausea or vomiting  Post-op Vital Signs: stable  Complications: No apparent anesthesia complications

## 2011-12-04 NOTE — Interval H&P Note (Signed)
History and Physical Interval Note:  12/04/2011 4:46 PM  Weston Settle  has presented today for surgery, with the diagnosis of ureteral calculus  The various methods of treatment have been discussed with the patient and family. After consideration of risks, benefits and other options for treatment, the patient has consented to  Procedure(s) (LRB): CYSTOSCOPY WITH RETROGRADE PYELOGRAM, URETEROSCOPY AND STENT PLACEMENT (Left) HOLMIUM LASER APPLICATION (Left) as a surgical intervention .  The patient's history has been reviewed, patient examined, no change in status, stable for surgery.  I have reviewed the patient's chart and labs.  Questions were answered to the patient's satisfaction.     [name]

## 2011-12-04 NOTE — Transfer of Care (Signed)
Immediate Anesthesia Transfer of Care Note  Patient: [name]  Procedure(s) Performed: Procedure(s) (LRB): CYSTOSCOPY WITH RETROGRADE PYELOGRAM, URETEROSCOPY AND STENT PLACEMENT (Left) HOLMIUM LASER APPLICATION (Left)  Patient Location: PACU  Anesthesia Type: General  Level of Consciousness: awake, alert  and oriented  Airway & Oxygen Therapy: Patient Spontanous Breathing and Patient connected to face mask oxygen  Post-op Assessment: Report given to PACU RN and Post -op Vital signs reviewed and stable  Post vital signs: Reviewed and stable  Complications: No apparent anesthesia complications

## 2011-12-04 NOTE — H&P (Signed)
stones   History of Present Illness      31 yo WM, patient of Dr. Belva Crome (last seen 06/08/09), presented initially 11/21/11 as a WI because of Lt flank pain with a history of stones.  He was seen at [facility] ER on 11/18/11 & 11/20/11 with Lt flank pain, nausea Saturday and Sunday.  CT scan showed:  1.  Bilateral renal stones  2.  Largest stone on the left is a 7mm Lt upper pole  3.  Largest stone on the right is a 3mm Rt upper pole  4.  5mm Lt proximal ureteral stone with mild hydronephrosis.  He returned to the office 11/29/11 having had persistent intermittent left flank pains.  The pain Sat. 8/17 and Sunday 8/18 was unbearable and he almost had to return to the ER but his pain was controlled with Percocet prn. He again developed severe left flank pain into the LLQ abdomen the evening of 11/28/11, and this eased off with Naproxen.  He had not seen the stone pass.  He denied gross hematuria, dysuria, fever or chills. He had not been on alpha blockers for MET. KUB at that visit showed the previously noted 3-70mm left proximal ureteral stone to have progressed distally to the left UVJ.  Unfortunately, the previously noted 7mm LUP stone had dropped out into the proximal left ureter. Interval Hx: He was started on Rapaflo qhs for MET.  He has been taking this as prescribed and has not yet seen the stone pass.  He has been straining his uirne consistently.  He has continued with intermittently severe left flank pains into the LLQ abd/groin.  He denies gross hematuria, dysuria, fever or chills.  His pain generally responds to Percocet. Pt drinks 4-6 12 oz cans Mt. Dew/day.   Past Medical History Problems  1. History of  Distal Ureteral Stone On The Left 592.1 2. History of  Nephrolithiasis V13.01 3. History of  Oral Surgery Tooth Extraction  Surgical History Problems  1. History of  No Surgical Problems  Current Meds 1. AmLODIPine Besylate 5 MG Oral Tablet; Therapy: 12Jul2013 to 2. Naproxen 500 MG Oral  Tablet; Therapy: 10Aug2013 to 3. Oxycodone-Acetaminophen 10-325 MG Oral Tablet; TAKE 1 TABLET EVERY 4 TO 6 HOURS AS  NEEDED FOR PAIN; Therapy: 21Aug2013 to (Evaluate:28Aug2013); Last Rx:21Aug2013 4. Oxycodone-Acetaminophen 5-325 MG Oral Tablet; Therapy: 10Aug2013 to  Allergies Medication  1. No Known Drug Allergies  Family History Problems  1. Paternal history of  Cholecystectomy 2. Maternal history of  Cholecystectomy 3. Paternal history of  Family Health Status - Father's Age 31 yrs old 4. Maternal history of  Family Health Status - Mother's Age 29 yrs old 5. Family history of  Family Health Status Number Of Children One son and one daughter 63. Maternal history of  Murmurs 7. Maternal history of  Nephrolithiasis 8. Fraternal history of  Nephrolithiasis  Social History Problems  1. Caffeine Use 4-5 per day 2. Marital History - Currently Married 3. Never A Smoker 4. Occupation: Surveyor, minerals Denied  5. Alcohol Use 6. Tobacco Use  Review of Systems Genitourinary, constitutional, skin, eye, otolaryngeal, hematologic/lymphatic, cardiovascular, pulmonary, endocrine, musculoskeletal, gastrointestinal, neurological and psychiatric system(s) were reviewed and pertinent findings if present are noted.  Gastrointestinal: flank pain and abdominal pain.    Vitals Vital Signs [Data Includes: Last 1 Day]  26Aug2013 09:14AM  BMI Calculated: 26.52 BSA Calculated: 1.85 Height: 5 ft 6 in Weight: 165 lb  Blood Pressure: 136 / 88 Temperature: 97.2 F Heart  Rate: 61  Physical Exam Constitutional: Well nourished and well developed . No acute distress.  ENT:. The ears and nose are normal in appearance.  Neck: The appearance of the neck is normal and no neck mass is present.  Pulmonary: No respiratory distress and normal respiratory rhythm and effort.  Cardiovascular:. No peripheral edema.  Abdomen: mild left CVA tenderness.  Skin: Normal skin turgor, no visible rash and no visible  skin lesions.  Neuro/Psych:. Mood and affect are appropriate.    Results/Data Urine [Data Includes: Last 1 Day]   26Aug2013  COLOR YELLOW   APPEARANCE CLEAR   SPECIFIC GRAVITY 1.020   pH 6.5   GLUCOSE NEG mg/dL  BILIRUBIN NEG   KETONE NEG mg/dL  BLOOD SMALL   PROTEIN NEG mg/dL  UROBILINOGEN 0.2 mg/dL  NITRITE NEG   LEUKOCYTE ESTERASE NEG   SQUAMOUS EPITHELIAL/HPF NONE SEEN   WBC 0-2 WBC/hpf  RBC 7-10 RBC/hpf  BACTERIA NONE SEEN   CRYSTALS NONE SEEN   CASTS NONE SEEN    The following images/tracing/specimen were independently visualized: Marland Kitchen     KUB demonstrates minimal distal progression of the 7mm left proximal ureteral stone and the 3-32mm distal left ureteral stone. Nonobstructing left renal stones remain as well.  Left renal U/S demonstrates moderate hydroureteronephrosis with dilation to the renal pelvis.  Proximal ureter measures 1.14cm, renal pelvis measures 2.13cm.  There are multiple hyperechoic areas within the left kidney. The left kidney measures 12.3 x 6.3cm. PVR is 1.78ml.  The following clinical lab reports were reviewed: Marland Kitchen  Urinalysis Selected Results  UA With REFLEX 26Aug2013 08:36AM Bruning, Ashlyn   Test Name Result Flag Reference  COLOR YELLOW  YELLOW  APPEARANCE CLEAR  CLEAR  SPECIFIC GRAVITY 1.020  1.005-1.030  pH 6.5  5.0-8.0  GLUCOSE NEG mg/dL  NEG  BILIRUBIN NEG  NEG  KETONE NEG mg/dL  NEG  BLOOD SMALL A NEG  PROTEIN NEG mg/dL  NEG  UROBILINOGEN 0.2 mg/dL  4.0-9.8  NITRITE NEG  NEG  LEUKOCYTE ESTERASE NEG  NEG  SQUAMOUS EPITHELIAL/HPF NONE SEEN  RARE  WBC 0-2 WBC/hpf  <3  RBC 7-10 RBC/hpf A <3  BACTERIA NONE SEEN  RARE  CRYSTALS NONE SEEN  NONE SEEN  CASTS NONE SEEN  NONE SEEN   Assessment Assessed  1. Ureteral Stone Left 592.1 2. Diffuse Abdominal Pain Left 789.07   He has persistent 7mm left proximal ureteral stone and 4mm left distal ureteral stones despite MET.  He has continued with intermittently severe left flank pain requiring use of  narcotic pain medications so he is missing work.  We discussed treatment options being left ESWL vs cystoureteroscopy with possible HLL for extraction of ureteral stone(s).  He understands that with his stones being so far apart within the ureter, he might require more than 1 ESWL.  He also understands that with ureteroscopy, there is a possibility that only the distal stone can be removed if the ureter is too tight to pass the scope up to the more proximal stone.  In this case, he would have a DJ stent placed for gentle dilation of the left ureter followed by ESWL of the proximal 7mm left ureteral stone.  Dr. Annabell Howells has gone over risks and benefits of each procedure in detail and the pt elects to proceed with cystoureteroscopy.   Plan Diffuse Abdominal Pain (789.07)  1. RENAL U/S LEFT  Done: 26Aug2013 12:00AM Health Maintenance (V70.0)  2. UA With REFLEX  Done: 26Aug2013 08:36AM   Schedule  cystoscopu with left RGP, ureteroscopy, possible HLL, stone extraction and possible left DJ stent placement. Orders written. He will be scheduled for later this evening. Pt has a Rx for Percocet to use postoperatively. He will remain out of work until his stones have been definatively treated and/or pain is well controlled without need for narcotic pain medications.  Follow up pending success with cystoureteroscopy this evening.

## 2011-12-05 NOTE — Discharge Summary (Signed)
Physician Discharge Summary  Patient ID: [name] MRN: [id number] DOB/AGE: 1981-01-26 31 y.o.  Admit date: 12/04/2011 Discharge date: 12/05/2011  Admission Diagnoses: Left Ureteral stones  Discharge Diagnoses: Same Active Problems:  * No active hospital problems. *    Discharged Condition: good  Hospital Course: [name] had ureteroscopic removal of 2 left ureteral stones and a stent was placed.  He was discharged following the procedure.   Consults: None  Significant Diagnostic Studies: none  Treatments: surgery: As above.  Discharge Exam: Blood pressure 135/76, pulse 65, temperature 98 F (36.7 C), temperature source Oral, resp. rate 14, height 5\' 6"  (1.676 m), weight 74.503 kg (164 lb 4 oz), SpO2 100.00%.   Disposition: 01-Home or Self Care   Medication List  As of 12/05/2011  7:19 AM   TAKE these medications         amLODipine 5 MG tablet   Commonly known as: NORVASC   Take 5 mg by mouth at bedtime.      cetirizine 10 MG tablet   Commonly known as: ZYRTEC   Take 10 mg by mouth daily as needed. For allergies.      HYDROmorphone 2 MG tablet   Commonly known as: DILAUDID   Take 1 tablet (2 mg total) by mouth every 4 (four) hours as needed for pain.      ondansetron 8 MG disintegrating tablet   Commonly known as: ZOFRAN-ODT   Take 1 tablet (8 mg total) by mouth every 8 (eight) hours as needed for nausea.      phenazopyridine 200 MG tablet   Commonly known as: PYRIDIUM   Take 1 tablet (200 mg total) by mouth 3 (three) times daily as needed for pain.           Follow-up Information    Follow up with Anner Crete, MD. (Call for appt for 1-2 weeks from date of surgery)    Contact information:   [address] [place] [address] [place] [address] [phone]          Signed: Anner Crete 12/05/2011, 7:19 AM

## 2011-12-05 NOTE — Op Note (Signed)
NAMETREMON, SAINVIL NO.:  [id number]  MEDICAL RECORD NO.:  [id number]  LOCATION:  [facility]                         FACILITY:  [facility]  PHYSICIAN:  Excell Seltzer. Annabell Howells, M.D.    DATE OF BIRTH:  08/30/1980  DATE OF PROCEDURE:  12/04/2011 DATE OF DISCHARGE:  12/04/2011                              OPERATIVE REPORT   PROCEDURE:  Cystoscopy, left ureteroscopic stone extraction with holmium lasertripsy, and insertion of left double-J stent.  PREOPERATIVE DIAGNOSIS:  Left proximal and distal ureteral stones.  POSTOPERATIVE DIAGNOSIS:  Left proximal and distal ureteral stones.  SURGEON:  Excell Seltzer. Annabell Howells, MD  ANESTHESIA:  General.  SPECIMEN:  Stone fragments.  DRAINS:  A 6-French 26-cm double-J stent.  COMPLICATIONS:  None.  INDICATIONS:  Mr. [name] is a 31 year old white male with a history of stones who was seen a few weeks ago with a 4-mm distal stone.  He returned today with pain and was noted to also have a 7-mm left proximal stone just below the UPJ.  This stone actually was unchanged in location since his initial visit 2 weeks ago.  After discussing the options, he has elected to undergo attempted ureteroscopic stone extraction for both stones this evening.  FINDINGS OF PROCEDURE:  He was given 2 g of Ancef.  He was taken to the operating room where general anesthetic was induced.  He was placed in a lithotomy position.  His perineum and genitalia were prepped and draped with Betadine solution.  He was draped in usual sterile fashion. Cystoscopy was performed using a 22-French scope and 12-degree lens. Examination revealed a normal urethra.  The external sphincter was intact.  Prostatic urethra was short without obstruction.  The right ureteral orifice was unremarkable.  The left ureteral orifice was slightly edematous.  The bladder wall was smooth and pale without tumor, stones, or inflammation.  The right ureteral orifice was then cannulated with a guidewire  which was passed through the kidney without difficulty.  The cystoscope was then removed and a 12/14-French access sheath inner core was used to dilate the distal ureter.  A 6.5-French short ureteroscope was then passed per urethra and I was able to easily access the distal ureter and removed the stone intact.  An attempt to pass the rigid scope to the level of the proximal stone was unsuccessful, so at this point, I elected to proceed with flexible ureteroscopy.  The 12/14-French access sheath was then reassembled and inserted over the wire to just below the stone.  The core was removed along with the wire.  The digital flexible scope was then passed through the sheath and the stone was identified in the renal pelvis where it had been pushed back.  The stone was engaged with a 200 micron laser fiber at 0.5 watts and 20 Hz.  The stone was broken into manageable fragments.  The fragments were then removed using a Nitinol basket.  Once all significant fragments were removed, further inspection only revealed some very small 1-2 mm fragments.  A guidewire was then reinserted through the scope to the kidney.  The scope and sheath were removed, and the cystoscope was then reinserted over the wire.  A 6-French 26-cm double-J stent with string was inserted over the wire to the kidney under fluoroscopic guidance.  The wire was removed leaving good coil in the kidney and good coil in the bladder.  The bladder was drained and the cystoscope was removed with the stent string intact. The urethra was instilled with 10 mL of 2% lidocaine jelly and a B and O suppository was placed.  The string was secured to the patient's penis. He was taken down from lithotomy position.  His anesthetic was reversed. He was moved to recovery room in stable condition.  There were no complications.  The family was given the stone fragments to bring to the office.     Excell Seltzer. Annabell Howells, M.D.     JJW/MEDQ  D:   12/04/2011  T:  12/05/2011  Job:  [id number]

## 2011-12-08 ENCOUNTER — Encounter (HOSPITAL_COMMUNITY): Payer: Self-pay

## 2012-09-18 ENCOUNTER — Ambulatory Visit (INDEPENDENT_AMBULATORY_CARE_PROVIDER_SITE_OTHER): Payer: BC Managed Care – PPO | Admitting: Family Medicine

## 2012-09-18 ENCOUNTER — Encounter: Payer: Self-pay | Admitting: Family Medicine

## 2012-09-18 VITALS — BP 129/82 | HR 72 | Temp 98.2°F | Ht 67.0 in | Wt 176.6 lb

## 2012-09-18 DIAGNOSIS — H8309 Labyrinthitis, unspecified ear: Secondary | ICD-10-CM

## 2012-09-18 DIAGNOSIS — H8303 Labyrinthitis, bilateral: Secondary | ICD-10-CM

## 2012-09-18 DIAGNOSIS — J069 Acute upper respiratory infection, unspecified: Secondary | ICD-10-CM

## 2012-09-18 DIAGNOSIS — M7522 Bicipital tendinitis, left shoulder: Secondary | ICD-10-CM

## 2012-09-18 DIAGNOSIS — M7520 Bicipital tendinitis, unspecified shoulder: Secondary | ICD-10-CM

## 2012-09-18 MED ORDER — AZITHROMYCIN 250 MG PO TABS: ORAL_TABLET | ORAL | Status: DC

## 2012-09-18 MED ORDER — TRIAMCINOLONE ACETONIDE 40 MG/ML IJ SUSP
60.0000 mg | Freq: Once | INTRAMUSCULAR | Status: AC
  Administered 2012-09-18: 60 mg via INTRAMUSCULAR

## 2012-09-18 NOTE — Progress Notes (Signed)
  Subjective:    Patient ID: [name], male    DOB: 1980/09/22, 32 y.o.   MRN: [id number]  HPI This 32 y.o. male presents for evaluation of allergies, left arm discomfort, and cough.  He states he has had allergies bad this year and has tried just about every otc antihistamine and they have not worked. He has had sx's for over a month now.  He states he is getting occasional bouts of dizziness.  He is also noticing some left inner bicep discomfort.  He denies any injury or strain.    Review of Systems  Constitutional: Negative.   HENT: Positive for rhinorrhea, sneezing, postnasal drip and sinus pressure. Negative for facial swelling, neck pain, neck stiffness and ear discharge.   Eyes: Positive for itching.  Respiratory: Negative.   Musculoskeletal: Positive for myalgias.  Neurological: Negative for facial asymmetry, light-headedness and headaches.       Objective:   Physical Exam  Constitutional: He appears well-developed and well-nourished.  HENT:  Head: Normocephalic and atraumatic.  Right Ear: External ear normal.  Left Ear: External ear normal.  Nose: Nose normal.  Mouth/Throat: Oropharynx is clear and moist.  Eyes: Conjunctivae and EOM are normal. Pupils are equal, round, and reactive to light.  Neck: Normal range of motion.  Cardiovascular: Normal rate and regular rhythm.   Pulmonary/Chest: Effort normal and breath sounds normal.  Musculoskeletal:  Left Biceps tendon tender and tight.          Assessment & Plan:  Labyrinthine vestibulitis, bilateral - Plan: triamcinolone acetonide (KENALOG-40) injection 60 mg Follow up prn.  Acute upper respiratory infections of unspecified site - Plan: azithromycin (ZITHROMAX) 250 MG tablet Otc cough and decongestants, Kenalog Injection 60mg  IM.  Biceps tendonitis on left - ibuprofen otc 600-800mg  po tid for a week, use heating pad for left bicep, RTC or follow up prn if sx's persist or continue.

## 2012-09-18 NOTE — Patient Instructions (Addendum)
Labyrinthitis (Inner Ear Inflammation) °Your exam shows you have an inner ear disturbance or labyrinthitis. The cause of this condition is not known. But it may be due to a virus infection. The symptoms of labyrinthitis include vertigo or dizziness made worse by motion, nausea and vomiting. The onset of labyrinthitis may be very sudden. It usually lasts for a few days and then clears up over 1-2 weeks. °The treatment of an inner ear disturbance includes bed rest and medications to reduce dizziness, nausea, and vomiting. You should stay away from alcohol, tranquilizers, caffeine, nicotine, or any medicine your doctor thinks may make your symptoms worse. Further testing may be needed to evaluate your hearing and balance system. Please see your doctor or go to the emergency room right away if you have: °· Increasing vertigo, earache, loss of hearing, or ear drainage. °· Headache, blurred vision, trouble walking, fainting, or fever. °· Persistent vomiting, dehydration, or extreme weakness. °Document Released: 03/27/2005 Document Revised: 06/19/2011 Document Reviewed: 09/12/2006 °ExitCare® Patient Information ©2014 [employer]. ° °

## 2013-02-13 ENCOUNTER — Other Ambulatory Visit: Payer: Self-pay

## 2014-07-23 ENCOUNTER — Ambulatory Visit: Payer: 59 | Attending: Podiatry | Admitting: Physical Therapy

## 2014-07-23 DIAGNOSIS — M79675 Pain in left toe(s): Secondary | ICD-10-CM

## 2014-07-23 NOTE — Therapy (Signed)
[facility] [address] [place], [place], [address] Phone: [phone]   Fax:  [phone]  Physical Therapy Evaluation  Patient Details  Name: [name] MRN: [id number] Date of Birth: 1980/12/02 Referring Provider:  Sherin Quarry, DPM  Encounter Date: 07/23/2014      PT End of Session - 07/23/14 0958    Visit Number 1   Number of Visits 12   Date for PT Re-Evaluation 09/17/14   PT Start Time 0945   PT Stop Time 1024   PT Time Calculation (min) 39 min      Past Medical History  Diagnosis Date  . Kidney stones   . Anginal pain     Past Surgical History  Procedure Laterality Date  . Dental surgery      There were no vitals filed for this visit.  Visit Diagnosis:  Great toe pain, left - Plan: PT plan of care cert/re-cert      Subjective Assessment - 07/23/14 0947    Subjective Numb over left big toe.   Patient Stated Goals Get back to work.            [facility] PT Assessment - 07/23/14 0001    Assessment   Medical Diagnosis S/p right cheilctomy.   Onset Date 06/08/14   Balance Screen   Has the patient fallen in the past 6 months No   Has the patient had a decrease in activity level because of a fear of falling?  No   Is the patient reluctant to leave their home because of a fear of falling?  No   ROM / Strength   AROM / PROM / Strength --  Left toe flexion AROM is limited by 75%.   Palpation   Palpation Tender on either side of patient's left foot incision in area of great toe.  Incision is scabbed over.   Special Tests    Special Tests --  Minimal left foot edema.   Ambulation/Gait   Ambulation/Gait --  Decreased stance time over left LE.                   [facility] Adult PT Treatment/Exercise - 07/23/14 0001    Modalities   Modalities Electrical Stimulation   Electrical Stimulation   Electrical Stimulation Location Dorsum left foot at Great toe area.   Electrical Stimulation Parameters  80-150 Hz x 20 minutes.   Electrical Stimulation Goals Pain                     PT Long Term Goals - 07/23/14 1004    PT LONG TERM GOAL #1   Title Ind with HEP.   Time 6   Period Weeks   Status New   PT LONG TERM GOAL #2   Title Restore normal left toe ROM.   Time 6   Period Weeks   Status New   PT LONG TERM GOAL #3   Title Perform ADL's with pain not > 2/10.   Time 6   Period Weeks   Status New               Plan - 07/23/14 1001    Clinical Impression Statement The patient underwent a left foot Cheilectomy on 06/08/14.  He reports numbness over hi left great toe.  His pain-level at rest is a 1-2/10.  He states he has noticed that his left foot sweeling is beginning to lessen.   Pt will benefit from skilled  therapeutic intervention in order to improve on the following deficits Decreased activity tolerance   Rehab Potential Excellent   PT Frequency 3x / week   PT Duration 4 weeks   PT Treatment/Interventions Therapeutic exercise;Manual techniques;Electrical Stimulation   PT Next Visit Plan Towel crunches; left toe ROM/stretching; e'stim.         Problem List Patient Active Problem List   Diagnosis Date Noted  . FLANK PAIN, RIGHT 07/14/2008    APPLEGATE, [place] MPT 07/23/2014, 10:24 AM  [facility] [address] [place], [place], [address] Phone: [phone]   Fax:  [phone]

## 2014-07-27 ENCOUNTER — Encounter: Payer: Self-pay | Admitting: Physical Therapy

## 2014-07-27 ENCOUNTER — Ambulatory Visit: Payer: 59 | Admitting: Physical Therapy

## 2014-07-27 DIAGNOSIS — M79675 Pain in left toe(s): Secondary | ICD-10-CM | POA: Diagnosis not present

## 2014-07-27 NOTE — Therapy (Signed)
[facility] [address] [place], [place], [address] Phone: [phone]   Fax:  [phone]  Physical Therapy Treatment  Patient Details  Name: [name] MRN: [id number] Date of Birth: Feb 04, 1981 Referring Provider:  Ernestina Penna, MD  Encounter Date: 07/27/2014      PT End of Session - 07/27/14 1244    Visit Number 2   Number of Visits 12   Date for PT Re-Evaluation 09/17/14   PT Start Time 1230   PT Stop Time 1314   PT Time Calculation (min) 44 min   Activity Tolerance Patient tolerated treatment well   Behavior During Therapy [facility] for tasks assessed/performed      Past Medical History  Diagnosis Date  . Kidney stones   . Anginal pain     Past Surgical History  Procedure Laterality Date  . Dental surgery      There were no vitals filed for this visit.  Visit Diagnosis:  Great toe pain, left      Subjective Assessment - 07/27/14 1236    Subjective no complaints after last tx   Currently in Pain? Yes   Pain Score 2    Pain Location Toe (Comment which one)  big toe   Pain Orientation Left   Pain Descriptors / Indicators Aching   Aggravating Factors  walking   Pain Relieving Factors rest                         [facility] Adult PT Treatment/Exercise - 07/27/14 0001    Exercises   Exercises --  toe   Electrical Stimulation   Electrical Stimulation Location Dorsum left foot at Great toe area.   Electrical Stimulation Parameters 80-150   Electrical Stimulation Goals Pain     There EX : - toe crunches with small towel x 5 min - tissue pickup with toes x - self ROM for toe flex/ext x 3 min -calf stretch x 5  Manual: Gentle PROM for toe ext/flex and Great Toe MTP                              PT Education - 07/27/14 1244    Education provided Yes   Education Details HEP ROM/Strength   Person(s) Educated Patient   Methods Explanation;Demonstration;Handout   Comprehension  Verbalized understanding;Returned demonstration             PT Long Term Goals - 07/23/14 1004    PT LONG TERM GOAL #1   Title Ind with HEP.   Time 6   Period Weeks   Status New   PT LONG TERM GOAL #2   Title Restore normal left toe ROM.   Time 6   Period Weeks   Status New   PT LONG TERM GOAL #3   Title Perform ADL's with pain not > 2/10.   Time 6   Period Weeks   Status New               Plan - 07/27/14 1315    Clinical Impression Statement patient tolerated tx well and understands HEP activities. pt has little pain. difficulty with a lot of walking due to swelling. antalgic gait today.goals ongoing.   Pt will benefit from skilled therapeutic intervention in order to improve on the following deficits Decreased activity tolerance   Rehab Potential Excellent   PT Frequency 3x / week  PT Duration 4 weeks   PT Treatment/Interventions Therapeutic exercise;Manual techniques;Electrical Stimulation   PT Next Visit Plan cont with POC   Consulted and Agree with Plan of Care Patient        Problem List Patient Active Problem List   Diagnosis Date Noted  . FLANK PAIN, RIGHT 07/14/2008    DUNFORD, CHRISTINA P, PTA 07/27/2014, 1:20 PM  [facility] [address] [place], [place], [id number] Phone: [phone]   Fax:  [phone]

## 2014-07-27 NOTE — Patient Instructions (Signed)
Toe Curl: Unilateral   With right foot resting on towel, slowly bunch up towel by curling toes. Repeat _20___ times per set. Do __2__ sets per session. Do __2_ sessions per day.  http://orth.exer.us/18   Copyright  VHI. All rights reserved.  Toe Pick-Up   Use toes of left foot to pick up objects from floor, such as coins, marble or tissueand place in opposite hand. Repeat __5__ times per set. Do _2___ sets per session. Do __2__ sessions per day.  http://orth.exer.us/48   Copyright  VHI. All rights reserved.  PROM: Toe Flexion / Extension   Gently grasp right toes and curl then straighten them. Hold each position __3__ seconds. Repeat _5___ times per set. Do __2__ sets per session. Do __2__ sessions per day. Have someone else move foot.  http://orth.exer.us/66   Copyright  VHI. All rights reserved.  Stretching: Calf - Towel   Sit with knee straight and towel looped around left foot. Gently pull on towel until stretch is felt in calf. Hold __30__ seconds. Repeat _3___ times per set. Do _1___ sets per session. Do ___1_ sessions per day.  http://orth.exer.us/706   Copyright  VHI. All rights reserved.

## 2014-07-31 ENCOUNTER — Encounter: Payer: Self-pay | Admitting: *Deleted

## 2014-07-31 ENCOUNTER — Ambulatory Visit: Payer: 59 | Admitting: *Deleted

## 2014-07-31 DIAGNOSIS — M79675 Pain in left toe(s): Secondary | ICD-10-CM | POA: Diagnosis not present

## 2014-07-31 NOTE — Therapy (Addendum)
[facility] [place], [place], [address] Phone: [phone]   Fax:  [phone]  Physical Therapy Treatment  Patient Details  Name: [name] MRN: [id number] Date of Birth: 10/11/1980 Referring Provider:  Chipper Herb, MD  Encounter Date: 07/31/2014    Past Medical History:  Diagnosis Date  . Anginal pain ([place])   . Hypertension   . Kidney stones     Past Surgical History:  Procedure Laterality Date  . DENTAL SURGERY    . FOOT SURGERY      There were no vitals filed for this visit.  Visit Diagnosis:  Great toe pain, left                                    PT Long Term Goals - 07/23/14 1004      PT LONG TERM GOAL #1   Title Ind with HEP.   Time 6   Period Weeks   Status New     PT LONG TERM GOAL #2   Title Restore normal left toe ROM.   Time 6   Period Weeks   Status New     PT LONG TERM GOAL #3   Title Perform ADL's with pain not > 2/10.   Time 6   Period Weeks   Status New               Problem List Patient Active Problem List   Diagnosis Date Noted  . FLANK PAIN, RIGHT 07/14/2008    APPLEGATE, [place], PTA 02/14/2016, 4:17 PM  [facility] [place], [place], [address] Phone: [phone]   Fax:  [phone]  PHYSICAL THERAPY DISCHARGE SUMMARY  Visits from Start of Care: 3.  Current functional level related to goals / functional outcomes: Please see above.   Remaining deficits: Goals unmet.   Education / Equipment: HEP. Plan: Patient agrees to discharge.  Patient goals were not met. Patient is being discharged due to not returning since the last visit.  ?????         Mali Applegate MPT

## 2014-08-05 ENCOUNTER — Encounter: Payer: 59 | Admitting: Physical Therapy

## 2015-06-21 ENCOUNTER — Encounter (HOSPITAL_COMMUNITY): Payer: Self-pay | Admitting: Emergency Medicine

## 2015-06-21 ENCOUNTER — Emergency Department (HOSPITAL_COMMUNITY)
Admission: EM | Admit: 2015-06-21 | Discharge: 2015-06-21 | Disposition: A | Discharge status: Home / Self Care | Payer: BLUE CROSS/BLUE SHIELD | Attending: Emergency Medicine | Admitting: Emergency Medicine

## 2015-06-21 ENCOUNTER — Emergency Department (HOSPITAL_COMMUNITY): Payer: BLUE CROSS/BLUE SHIELD

## 2015-06-21 DIAGNOSIS — R072 Precordial pain: Secondary | ICD-10-CM | POA: Insufficient documentation

## 2015-06-21 DIAGNOSIS — Z79899 Other long term (current) drug therapy: Secondary | ICD-10-CM | POA: Insufficient documentation

## 2015-06-21 DIAGNOSIS — Z87442 Personal history of urinary calculi: Secondary | ICD-10-CM | POA: Diagnosis not present

## 2015-06-21 DIAGNOSIS — R079 Chest pain, unspecified: Secondary | ICD-10-CM

## 2015-06-21 DIAGNOSIS — I10 Essential (primary) hypertension: Secondary | ICD-10-CM | POA: Diagnosis not present

## 2015-06-21 HISTORY — DX: Essential (primary) hypertension: I10

## 2015-06-21 LAB — BASIC METABOLIC PANEL
ANION GAP: 7 (ref 5–15)
BUN: 17 mg/dL (ref 6–20)
CHLORIDE: 106 mmol/L (ref 101–111)
CO2: 26 mmol/L (ref 22–32)
Calcium: 9.7 mg/dL (ref 8.9–10.3)
Creatinine, Ser: 1.21 mg/dL (ref 0.61–1.24)
GFR calc Af Amer: >60 mL/min (ref >60)
Glucose, Bld: 113 mg/dL — ABNORMAL HIGH (ref 65–99)
POTASSIUM: 3.8 mmol/L (ref 3.5–5.1)
SODIUM: 139 mmol/L (ref 135–145)

## 2015-06-21 LAB — CBC WITH DIFFERENTIAL/PLATELET
BASOS ABS: 0 10*3/uL (ref 0.0–0.1)
Basophils Relative: 0 %
EOS ABS: 0.1 10*3/uL (ref 0.0–0.7)
EOS PCT: 4 %
HCT: 45.8 % (ref 39.0–52.0)
HEMOGLOBIN: 15.7 g/dL (ref 13.0–17.0)
LYMPHS ABS: 1.1 10*3/uL (ref 0.7–4.0)
LYMPHS PCT: 33 %
MCH: 30.3 pg (ref 26.0–34.0)
MCHC: 34.3 g/dL (ref 30.0–36.0)
MCV: 88.4 fL (ref 78.0–100.0)
Monocytes Absolute: 0.4 10*3/uL (ref 0.1–1.0)
Monocytes Relative: 11 %
NEUTROS PCT: 52 %
Neutro Abs: 1.8 10*3/uL (ref 1.7–7.7)
PLATELETS: 170 10*3/uL (ref 150–400)
RBC: 5.18 MIL/uL (ref 4.22–5.81)
RDW: 12.5 % (ref 11.5–15.5)
WBC: 3.4 10*3/uL — AB (ref 4.0–10.5)

## 2015-06-21 LAB — TROPONIN I: Troponin I: <0.03 ng/mL (ref <0.031)

## 2015-06-21 LAB — LIPASE, BLOOD: LIPASE: 30 U/L (ref 11–51)

## 2015-06-21 NOTE — ED Provider Notes (Signed)
CSN: [id number]     Arrival date & time 06/21/15  0725 History   First MD Initiated Contact with Patient 06/21/15 [id number]     Chief Complaint  Patient presents with  . Chest Pain     (Consider location/radiation/quality/duration/timing/severity/associated sxs/prior Treatment) HPI Comments: The patient is a 35 year old male, he has a history of hypertension, he reports being seen by the cardiologist approximately 10 years ago when he was worked up for chest pain, [facility], no provocative testing was done immediately but he did have a stress test which was negative at a later time. This is all per patient report. He reports that last night he woke at 3:00 in the morning with chest pain which was left of the sternum, radiation of a numbness from his left mid arm to the fingertips, this symptom is a pinching or aching feeling, it seems to resolve spontaneously and lasts 1-2 minutes. He has some nausea, he did not sleep well, he reports that he never sleeps well. Over the weekend the patient was using chainsaws, doing lots of heavy lifting and cutting, he had also eaten fairly poorly eating lots of unhealthy foods according to the wife's report. At this time the patient has no symptoms. This chest pain is fluctuating today, does not seem to be exertional or positional  Patient is a 35 y.o. male presenting with chest pain. The history is provided by the patient.  Chest Pain   Past Medical History  Diagnosis Date  . Kidney stones   . Anginal pain ([facility])   . Hypertension    Past Surgical History  Procedure Laterality Date  . Dental surgery    . Foot surgery     Family History  Problem Relation Age of Onset  . Hypertension Mother   . Hypertension Father    Social History  Substance Use Topics  . Smoking status: Never Smoker   . Smokeless tobacco: None  . Alcohol Use: No    Review of Systems  Cardiovascular: Positive for chest pain.  All other systems reviewed  and are negative.     Allergies  Bee venom  Home Medications   Prior to Admission medications   Medication Sig Start Date End Date Taking? Authorizing Provider  fluticasone (FLONASE) 50 MCG/ACT nasal spray Place 1 spray into both nostrils daily as needed for allergies or rhinitis.   Yes Historical Provider, MD  ibuprofen (ADVIL,MOTRIN) 200 MG tablet Take 400 mg by mouth daily as needed.   Yes Historical Provider, MD  lisinopril (PRINIVIL,ZESTRIL) 5 MG tablet Take 5 mg by mouth daily. 06/21/15  Yes Historical Provider, MD   BP 124/73 mmHg  Pulse 79  Temp(Src) 98 F (36.7 C)  Resp 14  Ht 5\' 6"  (1.676 m)  Wt 180 lb (81.647 kg)  BMI 29.07 kg/m2  SpO2 96% Physical Exam  Constitutional: He appears well-developed and well-nourished. No distress.  HENT:  Head: Normocephalic and atraumatic.  Mouth/Throat: Oropharynx is clear and moist. No oropharyngeal exudate.  Eyes: Conjunctivae and EOM are normal. Pupils are equal, round, and reactive to light. Right eye exhibits no discharge. Left eye exhibits no discharge. No scleral icterus.  Neck: Normal range of motion. Neck supple. No JVD present. No thyromegaly present.  Cardiovascular: Normal rate, regular rhythm, normal heart sounds and intact distal pulses.  Exam reveals no gallop and no friction rub.   No murmur heard. Pulmonary/Chest: Effort normal and breath sounds normal. No respiratory distress. He has no wheezes.  He has no rales.  Abdominal: Soft. Bowel sounds are normal. He exhibits no distension and no mass. There is no tenderness.  Musculoskeletal: Normal range of motion. He exhibits no edema or tenderness.  Lymphadenopathy:    He has no cervical adenopathy.  Neurological: He is alert. Coordination normal.  Skin: Skin is warm and dry. No rash noted. No erythema.  Psychiatric: He has a normal mood and affect. His behavior is normal.  Nursing note and vitals reviewed.   ED Course  Procedures (including critical care time) Labs  Review Labs Reviewed  CBC WITH DIFFERENTIAL/PLATELET - Abnormal; Notable for the following:    WBC 3.4 (*)    All other components within normal limits  BASIC METABOLIC PANEL - Abnormal; Notable for the following:    Glucose, Bld 113 (*)    All other components within normal limits  TROPONIN I  LIPASE, BLOOD  TROPONIN I    Imaging Review Dg Chest 2 View  06/21/2015  CLINICAL DATA:  Chest pain left arm numbness beginning at 3 am this morning. EXAM: CHEST  2 VIEW COMPARISON:  CT chest and single view of the chest 01/21/2017. FINDINGS: The lungs are clear. Heart size is normal. No pneumothorax or pleural effusion. No focal bony abnormality. IMPRESSION: Negative chest. Electronically Signed   By: Drusilla Kanner M.D.   On: 06/21/2015 09:23   I have personally reviewed and evaluated these images and lab results as part of my medical decision-making.   EKG Interpretation   Date/Time:  Monday June 21 2015 07:31:03 EDT Ventricular Rate:  71 PR Interval:  143 QRS Duration: 79 QT Interval:  357 QTC Calculation: 388 R Axis:   53 Text Interpretation:  Sinus rhythm Normal ECG No old tracing to compare  Confirmed by KNAPP  MD-I, IVA ([id number]) on 06/21/2015 7:34:31 AM      EKG Interpretation  Date/Time:  Monday June 21 2015 11:43:38 EDT Ventricular Rate:  79 PR Interval:  146 QRS Duration: 77 QT Interval:  357 QTC Calculation: 409 R Axis:   41 Text Interpretation:  Sinus rhythm Low voltage, precordial leads ST elev, probable normal early repol pattern since last tracing no significant change Confirmed by Charlies Rayburn  MD, Saben Donigan [phone]) on 06/21/2015 12:09:19 PM        MDM   Final diagnoses:  Chest pain, unspecified chest pain type    The patient is well-appearing, his vital signs are relatively normal, mild hypertension only. The EKG shows no acute findings, level has been ordered, doubt acute coronary syndrome but with the patient's history of hypertension and a mother who had a  myocardial infarction in her 30s he will need repeat cardiology follow-up as well as 2 sets of troponins here. The patient is in agreement with the plan.  D/w cardiology - they agree that pt can pursue outpt stress - Dr. Tenny Craw in agreement - will have f/u outpt.  Eber Hong, MD 06/21/15 [phone]

## 2015-06-21 NOTE — ED Notes (Signed)
Pt c/o dizziness yesterday with central cp radiating down left arm since 0300 this am. Nad noted.

## 2015-06-21 NOTE — Discharge Instructions (Signed)

## 2015-06-30 NOTE — Progress Notes (Signed)
Patient ID: [name] Lefebre, male   DOB: Aug 26, 1980, 35 y.o.   MRN: [id number]     Cardiology Office Note   Date:  06/30/2015   ID:  [name] Pedretti, DOB Aug 26, 1980, MRN [id number]  PCP:  Josue HectorNYLAND,LEONARD ROBERT, MD  Cardiologist:   Charlton HawsPeter Kristan Brummitt, MD   No chief complaint on file.     History of Present Illness: The patient is a 35 year old male, he has a history of hypertension, he reports being seen by the cardiologist approximately 9 years ago when he was worked up for chest pain, was [facility], no provocative testing was done immediately but he did have a stress test which was negative at a later time. I reviewed Epic notes and he was seen by Dr Teressa LowerBensimohn with normal echo as well as CT with no PE He was supposed to f/u with Dr Antoine PocheHochrein in [place] where he lives Concern for family history of MI in mother at age 36  This is all per patient report. 06/21/15 seen in ER for chest pain he woke at 3:00 in the morning with chest pain which was left of the sternum, radiation of a numbness from his left mid arm to the fingertips, this symptom is a pinching or aching feeling, it seems to resolve spontaneously and lasts 1-2 minutes. He has some nausea, he did not sleep well, he reports that he never sleeps well. Over the weekend the patient was using chainsaws, doing lots of heavy lifting and cutting, he had also eaten fairly poorly eating lots of unhealthy foods according to the wife's report. At this time the patient has no symptoms. This chest pain is fluctuating today, does not seem to be exertional or positional ECG with no acute changes negative troponin x 2 and reviewed CXR with NAD.  Case discussed with Dr Tenny Crawoss who felt outpatient evaluation would be fine  Since Robinson/c has had allergies and sinus congestion.  Some reflux and indigestion after taking NSAI's   Non smoker No ETOH.      Past Medical History  Diagnosis Date  . Kidney stones   . Anginal pain (HCC)   . Hypertension       Past Surgical History  Procedure Laterality Date  . Dental surgery    . Foot surgery       Current Outpatient Prescriptions  Medication Sig Dispense Refill  . fluticasone (FLONASE) 50 MCG/ACT nasal spray Place 1 spray into both nostrils daily as needed for allergies or rhinitis.    Marland Kitchen. ibuprofen (ADVIL,MOTRIN) 200 MG tablet Take 400 mg by mouth daily as needed.    Marland Kitchen. lisinopril (PRINIVIL,ZESTRIL) 5 MG tablet Take 5 mg by mouth daily.     No current facility-administered medications for this visit.    Allergies:   Bee venom    Social History:  The patient  reports that he has never smoked. He does not have any smokeless tobacco history on file. He reports that he does not drink alcohol or use illicit drugs.   Family History:  The patient's family history includes Hypertension in his father and mother.    ROS:  Please see the history of present illness.   Otherwise, review of systems are positive for none.   All other systems are reviewed and negative.    PHYSICAL EXAM: VS:  There were no vitals taken for this visit. , BMI There is no weight on file to calculate BMI. Affect appropriate Healthy:  appears stated age HEENT: normal  Neck supple with no adenopathy JVP normal no bruits no thyromegaly Lungs clear with no wheezing and good diaphragmatic motion Heart:  S1/S2 no murmur, no rub, gallop or click PMI normal Abdomen: benighn, BS positve, no tenderness, no AAA no bruit.  No HSM or HJR Distal pulses intact with no bruits No edema Neuro non-focal Skin warm and dry No muscular weakness    EKG:  06/22/15  SR rate 78 normal ECG    Recent Labs: 06/21/2015: BUN 17; Creatinine, Ser 1.21; Hemoglobin 15.7; Platelets 170; Potassium 3.8; Sodium 139    Lipid Panel    Component Value Date/Time   CHOL  01/23/2007 0415    142        ATP III CLASSIFICATION:  <200     mg/dL   Desirable  161-096  mg/dL   Borderline High  >=045    mg/dL   High   TRIG 86 40/98/1191 0415    HDL 31* 01/23/2007 0415   CHOLHDL 4.6 01/23/2007 0415   VLDL 17 01/23/2007 0415   LDLCALC  01/23/2007 0415    94        Total Cholesterol/HDL:CHD Risk Coronary Heart Disease Risk Table                     Men   Women  1/2 Average Risk   3.4   3.3      Wt Readings from Last 3 Encounters:  06/21/15 81.647 kg (180 lb)  09/18/12 80.105 kg (176 lb 9.6 oz)  12/04/11 74.503 kg (164 lb 4 oz)      Other studies Reviewed: Additional studies/ records that were reviewed today include:  ER notes, labs, ECG see HPI.    ASSESSMENT AND PLAN:  1.  Chest Pain: muscular sounding f/u exercise stress echo  2. Allergies:  Continue flonase and claritin 3. Reflux:  Pepcid complete take NSAI's on full stomach    Current medicines are reviewed at length with the patient today.  The patient does not have concerns regarding medicines.  The following changes have been made:  no change  Labs/ tests ordered today include: Stress echo   No orders of the defined types were placed in this encounter.     Disposition:   FU with cardiology PRN     Signed, Charlton Haws, MD  06/30/2015 4:51 PM    [facility] [address] [place], [place], [place]  [address] Phone: [phone]; Fax: [phone]

## 2015-07-01 ENCOUNTER — Ambulatory Visit (INDEPENDENT_AMBULATORY_CARE_PROVIDER_SITE_OTHER): Payer: BLUE CROSS/BLUE SHIELD | Admitting: Cardiovascular Disease

## 2015-07-01 ENCOUNTER — Encounter: Payer: Self-pay | Admitting: Cardiovascular Disease

## 2015-07-01 VITALS — BP 122/74 | HR 85 | Ht 66.0 in | Wt 186.0 lb

## 2015-07-01 DIAGNOSIS — R072 Precordial pain: Secondary | ICD-10-CM | POA: Diagnosis not present

## 2015-07-01 NOTE — Patient Instructions (Signed)
Your physician recommends that you schedule a follow-up appointment in: as needed  Your physician has requested that you have en exercise stress myoview. For further information please visit [email or web address]. Please follow instruction sheet, as given.     Your physician recommends that you continue on your current medications as directed. Please refer to the Current Medication list given to you today.    Thank you for choosing [facility] !         

## 2015-07-05 ENCOUNTER — Other Ambulatory Visit (HOSPITAL_COMMUNITY): Payer: BLUE CROSS/BLUE SHIELD

## 2015-07-16 ENCOUNTER — Ambulatory Visit (HOSPITAL_COMMUNITY)
Admission: RE | Admit: 2015-07-16 | Discharge: 2015-07-16 | Disposition: A | Discharge status: Home / Self Care | Payer: BLUE CROSS/BLUE SHIELD | Source: Ambulatory Visit | Attending: Cardiovascular Disease | Admitting: Cardiovascular Disease

## 2015-07-16 DIAGNOSIS — R079 Chest pain, unspecified: Secondary | ICD-10-CM | POA: Diagnosis present

## 2015-07-16 DIAGNOSIS — R072 Precordial pain: Secondary | ICD-10-CM

## 2015-07-16 LAB — ECHOCARDIOGRAM STRESS TEST
CHL CUP MPHR: 185 {beats}/min
CHL RATE OF PERCEIVED EXERTION: 14
CSEPED: 9 min
CSEPEDS: 37 s
CSEPEW: 12.1 METS
Peak HR: 179 {beats}/min
Percent HR: 96 %
Rest HR: 64 {beats}/min

## 2016-06-12 ENCOUNTER — Ambulatory Visit (HOSPITAL_COMMUNITY)
Admission: RE | Admit: 2016-06-12 | Discharge: 2016-06-12 | Disposition: A | Discharge status: Home / Self Care | Payer: BLUE CROSS/BLUE SHIELD | Source: Ambulatory Visit | Attending: Adult Health Nurse Practitioner | Admitting: Adult Health Nurse Practitioner

## 2016-06-12 ENCOUNTER — Other Ambulatory Visit (HOSPITAL_COMMUNITY): Payer: Self-pay | Admitting: Adult Health Nurse Practitioner

## 2016-06-12 DIAGNOSIS — M5441 Lumbago with sciatica, right side: Secondary | ICD-10-CM

## 2017-12-25 IMAGING — DX DG LUMBAR SPINE 2-3V
3 series · 3 of 3 positions shown · non-contrast
Comparison: CT abdomen and pelvis November 20, 2011

CLINICAL DATA: Low back pain radiating to RIGHT leg for 3 months.
No injury.

EXAM:
LUMBAR SPINE - 2-3 VIEW

[l-spine ap]
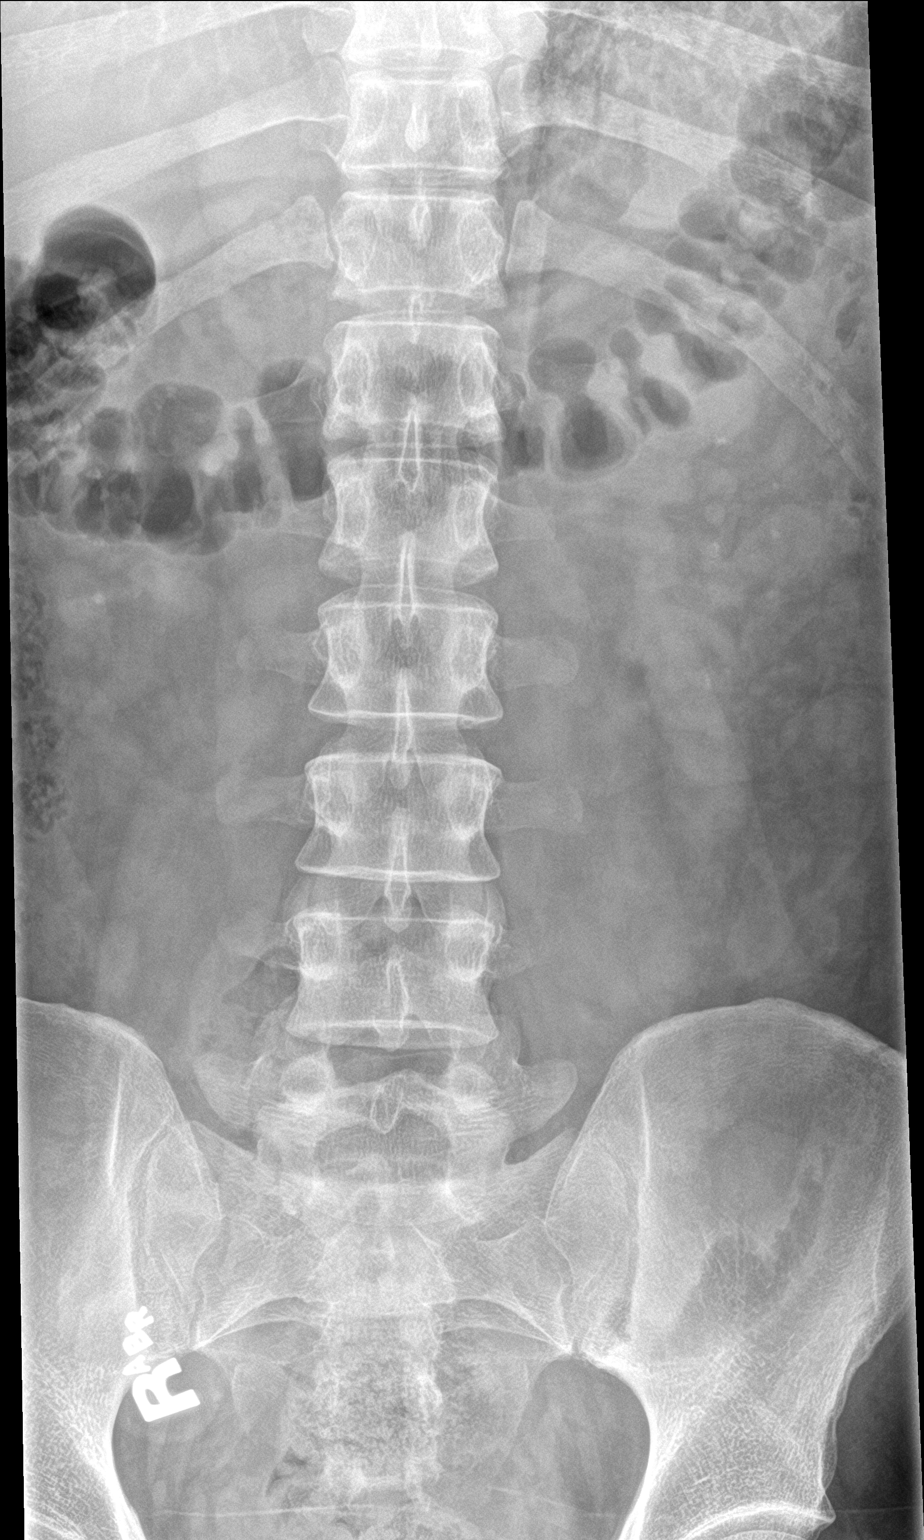

[l-spine lat]
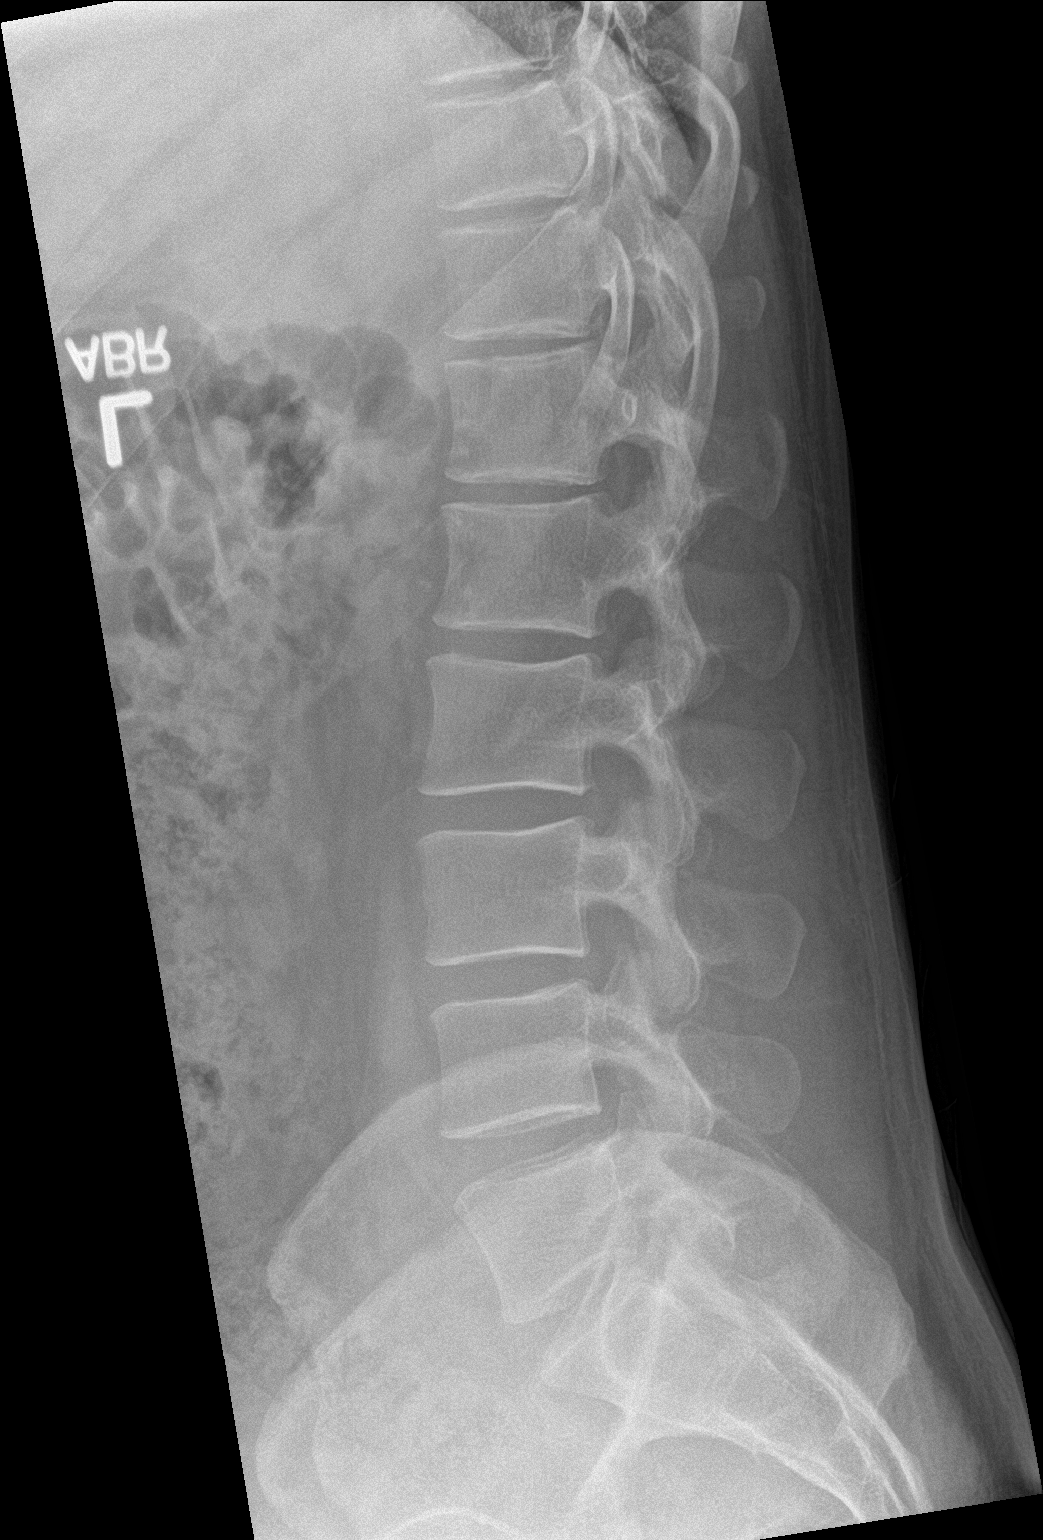

[l-spine spot]
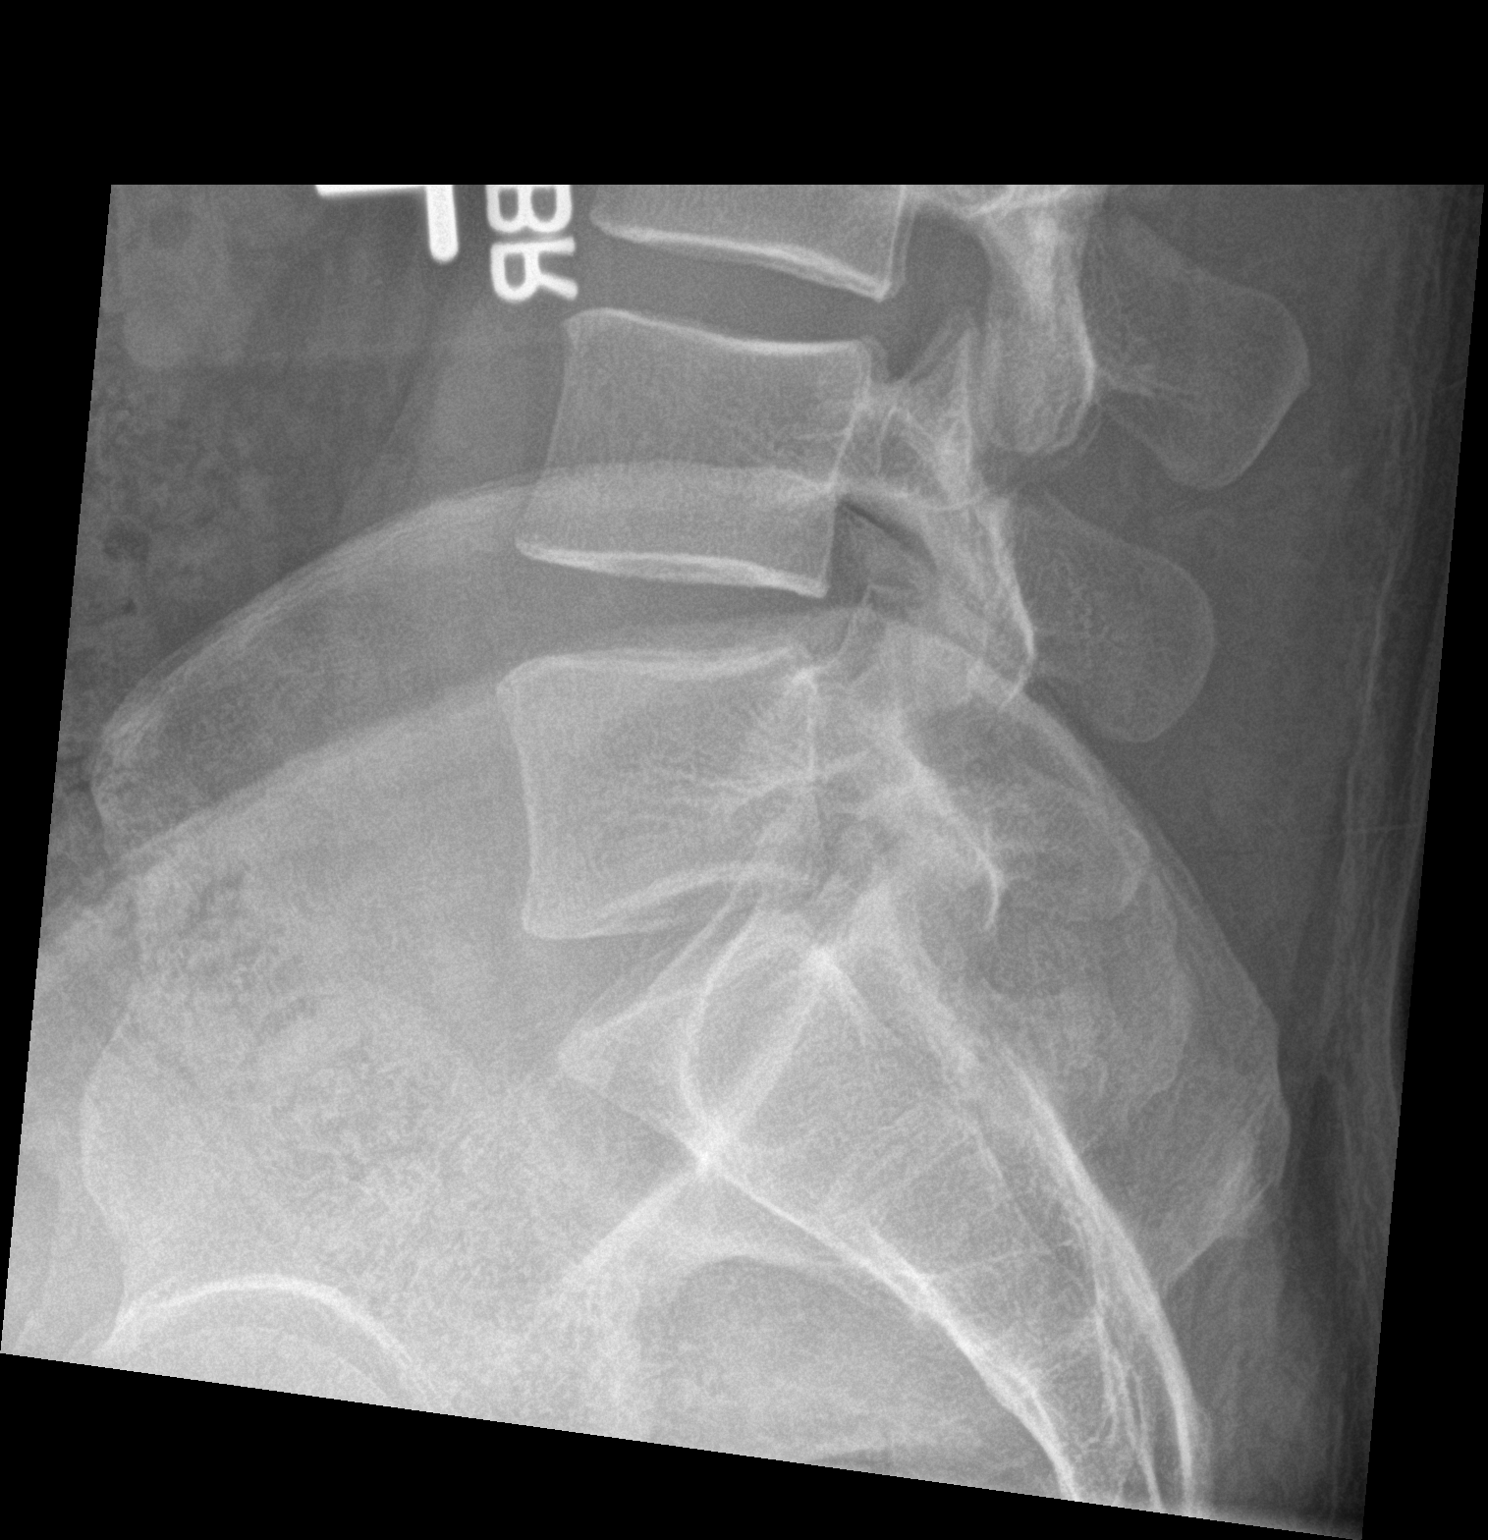

[3 of 3 positions shown; findings below may reference images not displayed]

FINDINGS: There is no evidence of lumbar spine fracture. Alignment is normal.
Intervertebral disc spaces are maintained.
IMPRESSION: Negative.

## 2019-04-16 ENCOUNTER — Other Ambulatory Visit: Payer: Self-pay

## 2019-04-16 ENCOUNTER — Ambulatory Visit: Payer: 59 | Attending: Internal Medicine

## 2019-04-16 DIAGNOSIS — Z20822 Contact with and (suspected) exposure to covid-19: Secondary | ICD-10-CM

## 2019-04-17 LAB — NOVEL CORONAVIRUS, NAA: SARS-CoV-2, NAA: NOT DETECTED

## 2019-04-18 ENCOUNTER — Telehealth: Payer: Self-pay | Admitting: *Deleted

## 2019-04-18 NOTE — Telephone Encounter (Signed)
Pt given result of COVID test; he verbalized understanding. 

## 2019-10-21 ENCOUNTER — Emergency Department (HOSPITAL_COMMUNITY): Admission: EM | Admit: 2019-10-21 | Discharge: 2019-10-21 | Discharge status: Against Medical Advice | Payer: 59

## 2019-11-03 ENCOUNTER — Ambulatory Visit (HOSPITAL_COMMUNITY)
Admission: RE | Admit: 2019-11-03 | Discharge: 2019-11-03 | Disposition: A | Discharge status: Home / Self Care | Payer: 59 | Source: Ambulatory Visit | Attending: Urology | Admitting: Urology

## 2019-11-03 ENCOUNTER — Ambulatory Visit (HOSPITAL_COMMUNITY): Payer: 59

## 2019-11-03 ENCOUNTER — Encounter (HOSPITAL_COMMUNITY)
Admission: RE | Disposition: A | Discharge status: Home / Self Care | Payer: Self-pay | Source: Ambulatory Visit | Attending: Urology

## 2019-11-03 ENCOUNTER — Encounter (HOSPITAL_COMMUNITY): Payer: Self-pay | Admitting: Urology

## 2019-11-03 ENCOUNTER — Ambulatory Visit (HOSPITAL_COMMUNITY): Payer: 59 | Admitting: Anesthesiology

## 2019-11-03 ENCOUNTER — Other Ambulatory Visit: Payer: Self-pay | Admitting: Urology

## 2019-11-03 DIAGNOSIS — Z87442 Personal history of urinary calculi: Secondary | ICD-10-CM | POA: Insufficient documentation

## 2019-11-03 DIAGNOSIS — Z8379 Family history of other diseases of the digestive system: Secondary | ICD-10-CM | POA: Diagnosis not present

## 2019-11-03 DIAGNOSIS — Z20822 Contact with and (suspected) exposure to covid-19: Secondary | ICD-10-CM | POA: Insufficient documentation

## 2019-11-03 DIAGNOSIS — Z841 Family history of disorders of kidney and ureter: Secondary | ICD-10-CM | POA: Diagnosis not present

## 2019-11-03 DIAGNOSIS — N201 Calculus of ureter: Secondary | ICD-10-CM | POA: Insufficient documentation

## 2019-11-03 HISTORY — DX: Personal history of urinary calculi: Z87.442

## 2019-11-03 HISTORY — DX: Anxiety disorder, unspecified: F41.9

## 2019-11-03 HISTORY — PX: CYSTOSCOPY/URETEROSCOPY/HOLMIUM LASER/STENT PLACEMENT: SHX6546

## 2019-11-03 LAB — CBC
HCT: 46.4 % (ref 39.0–52.0)
Hemoglobin: 15.9 g/dL (ref 13.0–17.0)
MCH: 30.6 pg (ref 26.0–34.0)
MCHC: 34.3 g/dL (ref 30.0–36.0)
MCV: 89.2 fL (ref 80.0–100.0)
Platelets: 195 10*3/uL (ref 150–400)
RBC: 5.2 MIL/uL (ref 4.22–5.81)
RDW: 12.7 % (ref 11.5–15.5)
WBC: 4.9 10*3/uL (ref 4.0–10.5)
nRBC: 0 % (ref 0.0–0.2)

## 2019-11-03 LAB — BASIC METABOLIC PANEL
Anion gap: 11 (ref 5–15)
BUN: 11 mg/dL (ref 6–20)
CO2: 25 mmol/L (ref 22–32)
Calcium: 9.8 mg/dL (ref 8.9–10.3)
Chloride: 103 mmol/L (ref 98–111)
Creatinine, Ser: 1.28 mg/dL — ABNORMAL HIGH (ref 0.61–1.24)
GFR calc Af Amer: >60 mL/min (ref >60)
GFR calc non Af Amer: >60 mL/min (ref >60)
Glucose, Bld: 106 mg/dL — ABNORMAL HIGH (ref 70–99)
Potassium: 4.2 mmol/L (ref 3.5–5.1)
Sodium: 139 mmol/L (ref 135–145)

## 2019-11-03 LAB — SARS CORONAVIRUS 2 BY RT PCR (HOSPITAL ORDER, PERFORMED IN ~~LOC~~ HOSPITAL LAB): SARS Coronavirus 2: NEGATIVE

## 2019-11-03 SURGERY — CYSTOSCOPY/URETEROSCOPY/HOLMIUM LASER/STENT PLACEMENT
Anesthesia: General | Laterality: Left

## 2019-11-03 MED ORDER — DEXAMETHASONE SODIUM PHOSPHATE 10 MG/ML IJ SOLN
INTRAMUSCULAR | Status: AC
  Filled 2019-11-03: qty 1

## 2019-11-03 MED ORDER — OXYCODONE HCL 5 MG PO TABS: 5.0000 mg | ORAL_TABLET | Freq: Once | ORAL | Status: DC | PRN

## 2019-11-03 MED ORDER — PROPOFOL 10 MG/ML IV BOLUS
INTRAVENOUS | Status: DC | PRN
  Administered 2019-11-03: 200 mg via INTRAVENOUS

## 2019-11-03 MED ORDER — PROMETHAZINE HCL 25 MG/ML IJ SOLN: 6.2500 mg | INTRAMUSCULAR | Status: DC | PRN

## 2019-11-03 MED ORDER — FENTANYL CITRATE (PF) 100 MCG/2ML IJ SOLN
INTRAMUSCULAR | Status: AC
  Filled 2019-11-03: qty 2

## 2019-11-03 MED ORDER — ACETAMINOPHEN 500 MG PO TABS
ORAL_TABLET | ORAL | Status: AC
  Filled 2019-11-03: qty 2

## 2019-11-03 MED ORDER — LIDOCAINE 2% (20 MG/ML) 5 ML SYRINGE
INTRAMUSCULAR | Status: AC
  Filled 2019-11-03: qty 5

## 2019-11-03 MED ORDER — CHLORHEXIDINE GLUCONATE 0.12 % MT SOLN
15.0000 mL | Freq: Once | OROMUCOSAL | Status: AC
  Administered 2019-11-03: 15 mL via OROMUCOSAL

## 2019-11-03 MED ORDER — OXYCODONE HCL 5 MG/5ML PO SOLN: 5.0000 mg | Freq: Once | ORAL | Status: DC | PRN

## 2019-11-03 MED ORDER — FENTANYL CITRATE (PF) 100 MCG/2ML IJ SOLN
INTRAMUSCULAR | Status: DC | PRN
  Administered 2019-11-03: 50 ug via INTRAVENOUS
  Administered 2019-11-03: 100 ug via INTRAVENOUS
  Administered 2019-11-03: 50 ug via INTRAVENOUS

## 2019-11-03 MED ORDER — ONDANSETRON HCL 4 MG PO TABS: 4.0000 mg | ORAL_TABLET | Freq: Four times a day (QID) | ORAL | 1 refills | Status: AC | PRN

## 2019-11-03 MED ORDER — LACTATED RINGERS IV SOLN: INTRAVENOUS | Status: DC

## 2019-11-03 MED ORDER — LIDOCAINE 2% (20 MG/ML) 5 ML SYRINGE
INTRAMUSCULAR | Status: DC | PRN
  Administered 2019-11-03: 60 mg via INTRAVENOUS

## 2019-11-03 MED ORDER — ACETAMINOPHEN 500 MG PO TABS
1000.0000 mg | ORAL_TABLET | Freq: Once | ORAL | Status: AC
  Administered 2019-11-03: 1000 mg via ORAL

## 2019-11-03 MED ORDER — PROPOFOL 10 MG/ML IV BOLUS
INTRAVENOUS | Status: AC
  Filled 2019-11-03: qty 20

## 2019-11-03 MED ORDER — DEXAMETHASONE SODIUM PHOSPHATE 10 MG/ML IJ SOLN
INTRAMUSCULAR | Status: DC | PRN
  Administered 2019-11-03: 10 mg via INTRAVENOUS

## 2019-11-03 MED ORDER — SODIUM CHLORIDE 0.9 % IR SOLN
Status: DC | PRN
  Administered 2019-11-03: 3000 mL

## 2019-11-03 MED ORDER — MIDAZOLAM HCL 5 MG/5ML IJ SOLN
INTRAMUSCULAR | Status: DC | PRN
  Administered 2019-11-03: 2 mg via INTRAVENOUS

## 2019-11-03 MED ORDER — CEFAZOLIN SODIUM-DEXTROSE 2-4 GM/100ML-% IV SOLN
2.0000 g | INTRAVENOUS | Status: AC
  Administered 2019-11-03: 2 g via INTRAVENOUS
  Filled 2019-11-03: qty 100

## 2019-11-03 MED ORDER — SODIUM CHLORIDE 0.9% FLUSH: 3.0000 mL | Freq: Two times a day (BID) | INTRAVENOUS | Status: DC

## 2019-11-03 MED ORDER — ONDANSETRON HCL 4 MG/2ML IJ SOLN
INTRAMUSCULAR | Status: AC
  Filled 2019-11-03: qty 2

## 2019-11-03 MED ORDER — MIDAZOLAM HCL 2 MG/2ML IJ SOLN
INTRAMUSCULAR | Status: AC
  Filled 2019-11-03: qty 2

## 2019-11-03 MED ORDER — ONDANSETRON HCL 4 MG/2ML IJ SOLN
INTRAMUSCULAR | Status: DC | PRN
  Administered 2019-11-03: 4 mg via INTRAVENOUS

## 2019-11-03 MED ORDER — FENTANYL CITRATE (PF) 100 MCG/2ML IJ SOLN: 25.0000 ug | INTRAMUSCULAR | Status: DC | PRN

## 2019-11-03 MED ORDER — IOHEXOL 300 MG/ML  SOLN
INTRAMUSCULAR | Status: DC | PRN
  Administered 2019-11-03: 1 mL

## 2019-11-03 SURGICAL SUPPLY — 22 items
BAG URO CATCHER STRL LF (MISCELLANEOUS) ×3 IMPLANT
BASKET STONE NCOMPASS (UROLOGICAL SUPPLIES) IMPLANT
CATH URET 5FR 28IN OPEN ENDED (CATHETERS) ×2 IMPLANT
CATH URET DUAL LUMEN 6-10FR 50 (CATHETERS) IMPLANT
CLOTH BEACON ORANGE TIMEOUT ST (SAFETY) ×3 IMPLANT
EXTRACTOR STONE NITINOL NGAGE (UROLOGICAL SUPPLIES) ×2 IMPLANT
FIBER LASER FLEXIVA 1000 (UROLOGICAL SUPPLIES) IMPLANT
FIBER LASER FLEXIVA 365 (UROLOGICAL SUPPLIES) ×2 IMPLANT
FIBER LASER FLEXIVA 550 (UROLOGICAL SUPPLIES) IMPLANT
GLOVE SURG SS PI 8.0 STRL IVOR (GLOVE) ×3 IMPLANT
GOWN STRL REUS W/TWL XL LVL3 (GOWN DISPOSABLE) ×3 IMPLANT
GUIDEWIRE STR DUAL SENSOR (WIRE) ×3 IMPLANT
IV NS IRRIG 3000ML ARTHROMATIC (IV SOLUTION) ×3 IMPLANT
KIT TURNOVER KIT A (KITS) IMPLANT
LASER FIBER FLEXIV TRACTIP 200 (Laser) IMPLANT
MANIFOLD NEPTUNE II (INSTRUMENTS) ×3 IMPLANT
PACK CYSTO (CUSTOM PROCEDURE TRAY) ×3 IMPLANT
SHEATH URETERAL 12FRX35CM (MISCELLANEOUS) ×2 IMPLANT
STENT URET 6FRX24 CONTOUR (STENTS) ×2 IMPLANT
TUBING CONNECTING 10 (TUBING) ×2 IMPLANT
TUBING CONNECTING 10' (TUBING) ×1
TUBING UROLOGY SET (TUBING) ×3 IMPLANT

## 2019-11-03 NOTE — Op Note (Signed)
Procedure: 1.  Cystoscopy with left retrograde pyelogram and interpretation. 2.  Left ureteroscopy with holmium laser application, stone extraction and insertion of left double-J stent. 3.  Application of fluoroscopy.  Preop diagnosis: 5 mm left distal ureteral stone.  Postop diagnosis: Same.  Surgeon: Dr. Bjorn Pippin.  Anesthesia: General.  Specimen: Stone fragments.  Drain: 6 [place] by 24 cm left contour double-J stent with tether.  EBL: None.  Complications: None.  Indications: The patient is a 39 year old white male with a 5 mm left distal ureter stone that is not progressed in the last month.  He remains intermittently symptomatic and is elected ureteroscopy for treatment.  Procedure: He was taken operating room where he is given Ancef.  A general anesthetic was induced.  He was placed in lithotomy position and fitted with PAS hose.  His perineum and genitalia were prepped with Betadine solution he was draped in usual sterile fashion.  Cystoscopy was performed using a 23 [place] scope and 30 degree lens.  Examination revealed a normal urethra.  The external sphincter was intact.  The prostatic urethra short without obstruction.  Examination of bladder revealed a smooth wall without tumors, stones or inflammation.  Ureteral orifices were unremarkable.  The left ureteral orifice was cannulated with 5 French opening catheter and Omnipaque was instilled.  The left retrograde pyelogram revealed a normal caliber distal ureter with a filling defect just above the intramural ureter consistent with a stone.  There was mild dilation proximally.  No other filling defects or stones were identified.  A sensor wire was then passed to the kidney through the open-ended catheter and the opening catheter was removed.  The cystoscope was removed and a 12 [place] inner core of a 35 cm access sheath was advanced over the wire to dilate the distal ureter.  It passed easily.  The assembled sheath with a 14  French diameter was then inserted also without difficulty.  The dual-lumen semirigid ureteroscope was then passed alongside the wire and the stone was identified.  A single attempt to remove it intact with the engage basket was unsuccessful due to the stone size.  The stone was then fragmented with a 365 m holmium laser fiber set on 0.3 J and 20 Hz.  Once adequately fragmented, the fragments were removed with the engage basket to the bladder.  Final inspection revealed no retained fragments and only minimal bleeding.  After removal of the ureteroscope, the cystoscope was reinserted over the wire and a 6 [place] by 24 cm contour double-J stent with tether was passed to the kidney under fluoroscopic guidance.  The wire was removed, leaving a good coil in the kidney and a good coil in the bladder.  The stones were then evacuated from the bladder and the cystoscope was removed leaving the stent string exiting the urethra.  The string was secured to the patient's penis.  He was taken down from lithotomy position, his anesthetic was reversed and he was moved recovery in stable condition.  The stone fragments were given to the patient's wife.  There were no complications.

## 2019-11-03 NOTE — Anesthesia Procedure Notes (Signed)
Procedure Name: LMA Insertion Date/Time: 11/03/2019 4:40 PM Performed by: Vanessa [place], CRNA Pre-anesthesia Checklist: Emergency Drugs available, Patient identified, Suction available and Patient being monitored Patient Re-evaluated:Patient Re-evaluated prior to induction Oxygen Delivery Method: Circle system utilized Preoxygenation: Pre-oxygenation with 100% oxygen Induction Type: IV induction Ventilation: Mask ventilation without difficulty LMA: LMA inserted LMA Size: 4.0 Number of attempts: 1 Placement Confirmation: positive ETCO2 and breath sounds checked- equal and bilateral Tube secured with: Tape Dental Injury: Teeth and Oropharynx as per pre-operative assessment

## 2019-11-03 NOTE — Interval H&P Note (Signed)
History and Physical Interval Note:  He continues to have intermittent pain.   11/03/2019 4:20 PM  [name]  has presented today for surgery, with the diagnosis of left ureteral obstruction.  The various methods of treatment have been discussed with the patient and family. After consideration of risks, benefits and other options for treatment, the patient has consented to  Procedure(s): CYSTOSCOPY/URETEROSCOPY/HOLMIUM LASER/STENT PLACEMENT (Left) as a surgical intervention.  The patient's history has been reviewed, patient examined, no change in status, stable for surgery.  I have reviewed the patient's chart and labs.  Questions were answered to the patient's satisfaction.     Bjorn Pippin

## 2019-11-03 NOTE — H&P (Signed)
I have ureteral stone.  HPI: [name] is a 39 year-old male established patient who is here for ureteral stone.  [name] is a former patient with a history of stones who was last seen in 2013 and 2019 with Dr. Annabell Howells. He had an LS series in 2018 and KUB in 2019 with bilateral renal stones in 2019.   UA today with 20-40 rbc. He passed a stone Sunday and one yesterday. He wonders if there is another about to pass and one in left side. KUB today shows a likely 5 mm stone left distal ureter on the edge of some bowel gas.   10/31/2019: Here today for f/u. Tamsulosin prescribed as well as pain medicine at last office visit. Patient remains symptomatic with pain in the left flank, less severe than on previous office visit assessment. Denies interval stone material passage. Voiding symptoms grossly stable but he did have some hesitancy and straining a few days ago. Denies burning or painful urination, visible blood in the urine, nausea/vomiting or fever/chills.   The problem is on the left side. He first stated noticing pain on approximately 10/21/2019. This is not his first kidney stone. He is currently having flank pain. He denies having back pain, groin pain, nausea, vomiting, fever, and chills. Pain is occuring on the left side. He has not caught a stone in his urine strainer since his symptoms began.   He has had ureteral stent and ureteroscopy for treatment of his stones in the past.     ALLERGIES: No Allergies    MEDICATIONS: Lisinopril 5 mg tablet  Tamsulosin Hcl 0.4 mg capsule 1/2 capsule PO Q HS  Bupropion Xl  Claritin  Gabapentin 300 mg capsule  Hydrocodone-Acetaminophen 10 mg-325 mg tablet 1 tablet PO Q 6 H PRN     GU PSH: Cysto Uretero Lithotripsy - 2013 Cystoscopy Insert Stent - 2013       PSH Notes: Cystoscopy With Ureteroscopy With Lithotripsy, Cystoscopy With Insertion Of Ureteral Stent Left, No Surgical Problems, Oral Surgery Tooth Extraction   NON-GU PSH: Dental Surgery  Procedure - 2010     GU PMH: Renal calculus, kidneys are fairly clear. Disc diet changes to prevent stones. - 10/23/2019, Bilateral, - 01/11/2018 (Stable), He has bilateral renal stones. he will need a repeat metabolic w/u when he has recovered from this visit. 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 , - 12/31/2017, Bilateral kidney stones, - 2014 Ureteral calculus, He has a probable right distal stone. I am going to give him tamsulosin and have him return in 2wks. If he continues to have symptoms, he will need a CT to clarify the location of the stone. - 12/31/2017, Calculus of ureter, - 2014, Calculus of distal right ureter, - 2014, Distal Ureteral Stone On The Left, - 2014 Flank Pain, Generalized abdominal pain - 2014 History of urolithiasis, Nephrolithiasis - 2014      PMH Notes:  2011-12-20 10:41:41 - Note: Penile Pain   NON-GU PMH: No Non-GU PMH    FAMILY HISTORY: Cholecystectomy - Mother, Father Family Health Status - Father alive at age 37 - Father Family Health Status - Mother's Age - Mother Family Health Status Number - Runs In Family Murmurs - Mother nephrolithiasis - Mother, Brother   SOCIAL HISTORY: Marital Status: Married Preferred Language: English; Race: White Current Smoking Status: Patient has never smoked.   Tobacco Use Assessment Completed: Used Tobacco in last 30 days? Drinks 4+ caffeinated drinks per day.     Notes: 1 son, 1 daughter  REVIEW OF SYSTEMS:    GU Review Male:   Patient reports frequent urination, get up at night to urinate, trouble starting your stream, and have to strain to urinate . Patient denies hard to postpone urination, burning/ pain with urination, leakage of urine, stream starts and stops, erection problems, and penile pain.  Gastrointestinal (Upper):   Patient denies nausea, vomiting, and indigestion/ heartburn.  Gastrointestinal (Lower):   Patient denies diarrhea and constipation.  Constitutional:   Patient denies fever, night sweats,  weight loss, and fatigue.  Skin:   Patient denies skin rash/ lesion and itching.  Eyes:   Patient denies blurred vision and double vision.  Ears/ Nose/ Throat:   Patient denies sore throat and sinus problems.  Hematologic/Lymphatic:   Patient denies swollen glands and easy bruising.  Cardiovascular:   Patient denies leg swelling and chest pains.  Respiratory:   Patient denies cough and shortness of breath.  Endocrine:   Patient denies excessive thirst.  Musculoskeletal:   Flank pain. Patient reports back pain. Patient denies joint pain.  Neurological:   Patient denies headaches and dizziness.  Psychologic:   Patient denies depression and anxiety.   VITAL SIGNS:      10/31/2019 08:15 AM  Weight 175 lb / 79.38 kg  Height 66 in / 167.64 cm  BP 127/76 mmHg  Pulse 65 /min  Temperature 98.9 F / 37.1 C  BMI 28.2 kg/m   MULTI-SYSTEM PHYSICAL EXAMINATION:    Constitutional: Well-nourished. No physical deformities. Normally developed. Good grooming.  Neck: Neck symmetrical, not swollen. Normal tracheal position.  Respiratory: No labored breathing, no use of accessory muscles.   Cardiovascular: Normal temperature, normal extremity pulses, no swelling, no varicosities.  Skin: No paleness, no jaundice, no cyanosis. No lesion, no ulcer, no rash.  Neurologic / Psychiatric: Oriented to time, oriented to place, oriented to person. No depression, no anxiety, no agitation.  Gastrointestinal: No mass, no tenderness, no rigidity, non obese abdomen.  Musculoskeletal: Normal gait and station of head and neck.     Complexity of Data:  Source Of History:  Patient, Medical Record Summary  Records Review:   Previous Doctor Records, Previous Hospital Records, Previous Patient Records  Urine Test Review:   Urinalysis  X-Ray Review: KUB: Reviewed Films. Discussed With Patient.     10/31/19  Urinalysis  Urine Appearance Clear   Urine Color Yellow   Urine Glucose Neg mg/dL  Urine Bilirubin Neg mg/dL   Urine Ketones Neg mg/dL  Urine Specific Gravity 1.025   Urine Blood Neg ery/uL  Urine pH 5.5   Urine Protein Neg mg/dL  Urine Urobilinogen 0.2 mg/dL  Urine Nitrites Neg   Urine Leukocyte Esterase Trace leu/uL  Urine WBC/hpf 0 - 5/hpf   Urine RBC/hpf NS (Not Seen)   Urine Epithelial Cells NS (Not Seen)   Urine Bacteria NS (Not Seen)   Urine Mucous Not Present   Urine Yeast NS (Not Seen)   Urine Trichomonas Not Present   Urine Cystals NS (Not Seen)   Urine Casts NS (Not Seen)   Urine Sperm Not Present    PROCEDURES:         KUB - 69485  A single view of the abdomen is obtained. The bilateral renal shadows or poorly visualized due to overlying bowel gas and stool pattern. There does appear to be several small nonobstructing calculi noted in each kidney. Distal left ureteral calculi remains grossly unchanged compared to previous office KUB study. No other obvious ureteral calculi noted. Bladder  grossly appears free of obstruction.      Patient confirmed No Neulasta OnPro Device.           Urinalysis w/Scope Dipstick Dipstick Cont'd Micro  Color: Yellow Bilirubin: Neg mg/dL WBC/hpf: 0 - 5/hpf  Appearance: Clear Ketones: Neg mg/dL RBC/hpf: NS (Not Seen)  Specific Gravity: 1.025 Blood: Neg ery/uL Bacteria: NS (Not Seen)  pH: 5.5 Protein: Neg mg/dL Cystals: NS (Not Seen)  Glucose: Neg mg/dL Urobilinogen: 0.2 mg/dL Casts: NS (Not Seen)    Nitrites: Neg Trichomonas: Not Present    Leukocyte Esterase: Trace leu/uL Mucous: Not Present      Epithelial Cells: NS (Not Seen)      Yeast: NS (Not Seen)      Sperm: Not Present    ASSESSMENT:      ICD-10 Details  1 GU:   Ureteral calculus - N20.1 Left, Acute, Systemic Symptoms   PLAN:           Orders Labs Urine Culture  X-Rays: KUB          Schedule Return Visit/Planned Activity: Other See Visit Notes - Follow up MD, Schedule Surgery          Document Letter(s):  Created for Patient: Clinical Summary         Notes:   He  desires treatment. He's had URS before with Dr Annabell Howells. ESWL would be a good option based on stone's visibility. I'll confirm with Dr Annabell Howells and then have pt. Contacted with recommended plan of care moving forward. He will remain on tamsulosin. He may continue pain medication as needed. Over-the-counter anti inflammatories also discussed for mild-to-moderate symptomatology. He will continue straining his urine as much as possible. He understands to remain well hydrated and nourished, remain active as possible. Clinic follow-up instructions as well as emergency department follow-up for over the weekend in the event he develops worsening symptomatology with uncontrolled pain, uncontrollable nausea/vomiting, painful inability to void, fever/chills.     For observation I described the risks which include but are not limited to silent renal damage, life-threatening infection, need for emergent surgery, failure to pass stone, and pain.   For ureteroscopy I described the risks which include heart attack, stroke, pulmonary embolus, death, bleeding, infection, damage to contiguous structures, positioning injury, ureteral stricture, ureteral avulsion, ureteral injury, need for ureteral stent, inability to perform ureteroscopy, need for an interval procedure, inability to clear stone burden, stent discomfort and pain.   For shockwave lithotripsy I described the risks which include arrhythmia, kidney contusion, kidney hemorrhage, need for transfusion, long-term risk of diabetes or hypertension, back discomfort, flank ecchymosis, flank abrasion, inability to break up stone, inability to pass stone fragments, Steinstrasse, infection associated with obstructing stones, need for different surgical procedure and possible need for repeat shockwave lithotripsy.

## 2019-11-03 NOTE — Transfer of Care (Signed)
Immediate Anesthesia Transfer of Care Note  Patient: [name]  Procedure(s) Performed: CYSTOSCOPY/URETEROSCOPY/HOLMIUM LASER/STENT PLACEMENT (Left )  Patient Location: PACU  Anesthesia Type:General  Level of Consciousness: drowsy and patient cooperative  Airway & Oxygen Therapy: Patient Spontanous Breathing and Patient connected to face mask  Post-op Assessment: Report given to RN and Post -op Vital signs reviewed and stable  Post vital signs: Reviewed and stable  Last Vitals:  Vitals Value Taken Time  BP    Temp    Pulse 76 11/03/19 1716  Resp 13 11/03/19 1716  SpO2 98 % 11/03/19 1716  Vitals shown include unvalidated device data.  Last Pain:  Vitals:   11/03/19 1343  TempSrc:   PainSc: 0-No pain         Complications: No complications documented.

## 2019-11-03 NOTE — Anesthesia Postprocedure Evaluation (Signed)
Anesthesia Post Note  Patient: [name]  Procedure(s) Performed: CYSTOSCOPY/URETEROSCOPY/HOLMIUM LASER/STENT PLACEMENT (Left )     Patient location during evaluation: PACU Anesthesia Type: General Level of consciousness: awake and alert Pain management: pain level controlled Vital Signs Assessment: post-procedure vital signs reviewed and stable Respiratory status: spontaneous breathing, nonlabored ventilation, respiratory function stable and patient connected to nasal cannula oxygen Cardiovascular status: blood pressure returned to baseline and stable Postop Assessment: no apparent nausea or vomiting Anesthetic complications: no   No complications documented.  Last Vitals:  Vitals:   11/03/19 1730 11/03/19 1740  BP: 123/72   Pulse: 90 78  Resp: 15 15  Temp:  36.6 C  SpO2: 100% 100%    Last Pain:  Vitals:   11/03/19 1740  TempSrc:   PainSc: 0-No pain                 Shelton Silvas

## 2019-11-03 NOTE — Anesthesia Preprocedure Evaluation (Addendum)
Anesthesia Evaluation  Patient identified by MRN, date of birth, ID band Patient awake    Reviewed: Allergy & Precautions, NPO status , Patient's Chart, lab work & pertinent test results  History of Anesthesia Complications Negative for: history of anesthetic complications  Airway Mallampati: II  TM Distance: >3 FB Neck ROM: Full    Dental  (+)    Pulmonary neg pulmonary ROS,    Pulmonary exam normal        Cardiovascular hypertension, Pt. on medications Normal cardiovascular exam     Neuro/Psych negative neurological ROS  negative psych ROS   GI/Hepatic negative GI ROS, Neg liver ROS,   Endo/Other  negative endocrine ROS  Renal/GU negative Renal ROS   left ureteral obstruction    Musculoskeletal negative musculoskeletal ROS (+)   Abdominal   Peds  Hematology negative hematology ROS (+)   Anesthesia Other Findings Day of surgery medications reviewed with patient.  Reproductive/Obstetrics negative OB ROS                            Anesthesia Physical Anesthesia Plan  ASA: II  Anesthesia Plan: General   Post-op Pain Management:    Induction: Intravenous  PONV Risk Score and Plan: 2 and Treatment may vary due to age or medical condition, Ondansetron, Dexamethasone and Midazolam  Airway Management Planned: LMA  Additional Equipment: None  Intra-op Plan:   Post-operative Plan: Extubation in OR  Informed Consent: I have reviewed the patients History and Physical, chart, labs and discussed the procedure including the risks, benefits and alternatives for the proposed anesthesia with the patient or authorized representative who has indicated his/her understanding and acceptance.     Dental advisory given  Plan Discussed with: CRNA  Anesthesia Plan Comments:        Anesthesia Quick Evaluation

## 2019-11-03 NOTE — Discharge Instructions (Signed)
Ureteral Stent Implantation, Care After This sheet gives you information about how to care for yourself after your procedure. Your health care provider may also give you more specific instructions. If you have problems or questions, contact your health care provider. What can I expect after the procedure? After the procedure, it is common to have:  Nausea.  Mild pain when you urinate. You may feel this pain in your lower back or lower abdomen. The pain should stop within a few minutes after you urinate. This may last for up to 1 week.  A small amount of blood in your urine for several days. Follow these instructions at home: Medicines  Take over-the-counter and prescription medicines only as told by your health care provider.  If you were prescribed an antibiotic medicine, take it as told by your health care provider. Do not stop taking the antibiotic even if you start to feel better.  Do not drive for 24 hours if you were given a sedative during your procedure.  Ask your health care provider if the medicine prescribed to you requires you to avoid driving or using heavy machinery. Activity  Rest as told by your health care provider.  Avoid sitting for a long time without moving. Get up to take short walks every 1-2 hours. This is important to improve blood flow and breathing. Ask for help if you feel weak or unsteady.  Return to your normal activities as told by your health care provider. Ask your health care provider what activities are safe for you. General instructions   Watch for any blood in your urine. Call your health care provider if the amount of blood in your urine increases.  If you have a catheter: ? Follow instructions from your health care provider about taking care of your catheter and collection bag. ? Do not take baths, swim, or use a hot tub until your health care provider approves. Ask your health care provider if you may take showers. You may only be allowed to  take sponge baths.  Drink enough fluid to keep your urine pale yellow.  Do not use any products that contain nicotine or tobacco, such as cigarettes, e-cigarettes, and chewing tobacco. These can delay healing after surgery. If you need help quitting, ask your health care provider.  Keep all follow-up visits as told by your health care provider. This is important. Contact a health care provider if:  You have pain that gets worse or does not get better with medicine, especially pain when you urinate.  You have difficulty urinating.  You feel nauseous or you vomit repeatedly during a period of more than 2 days after the procedure. Get help right away if:  Your urine is dark red or has blood clots in it.  You are leaking urine (have incontinence).  The end of the stent comes out of your urethra.  You cannot urinate.  You have sudden, sharp, or severe pain in your abdomen or lower back.  You have a fever.  You have swelling or pain in your legs.  You have difficulty breathing. Summary  After the procedure, it is common to have mild pain when you urinate that goes away within a few minutes after you urinate. This may last for up to 1 week.  Watch for any blood in your urine. Call your health care provider if the amount of blood in your urine increases.  Take over-the-counter and prescription medicines only as told by your health care provider.  Drink   enough fluid to keep your urine pale yellow. This information is not intended to replace advice given to you by your health care provider. Make sure you discuss any questions you have with your health care provider.  You may pull the stent on Thursday morning.   Document Revised: 01/01/2018 Document Reviewed: 01/02/2018 [employer] Patient Education  2020 [employer].

## 2019-11-04 ENCOUNTER — Encounter (HOSPITAL_COMMUNITY): Payer: Self-pay | Admitting: Urology

## 2024-03-13 ENCOUNTER — Ambulatory Visit: Payer: Self-pay | Admitting: Surgery

## 2024-03-14 ENCOUNTER — Encounter (HOSPITAL_BASED_OUTPATIENT_CLINIC_OR_DEPARTMENT_OTHER): Payer: Self-pay | Admitting: Surgery

## 2024-03-21 ENCOUNTER — Ambulatory Visit (HOSPITAL_BASED_OUTPATIENT_CLINIC_OR_DEPARTMENT_OTHER): Admit: 2024-03-21 | Payer: Self-pay | Admitting: Surgery

## 2024-03-21 ENCOUNTER — Encounter (HOSPITAL_BASED_OUTPATIENT_CLINIC_OR_DEPARTMENT_OTHER): Payer: Self-pay

## 2024-03-21 HISTORY — DX: Headache, unspecified: R51.9

## 2024-03-21 HISTORY — DX: Umbilical hernia without obstruction or gangrene: K42.9

## 2024-03-21 SURGERY — REPAIR, HERNIA, UMBILICAL, ADULT
Anesthesia: General
